# Patient Record
Sex: Male | Born: 1935 | Race: White | Hispanic: No | Marital: Married | Smoking: Former smoker
Health system: Southern US, Community
[De-identification: ages and names within clinical notes are randomized; demographics above are authoritative.]

## PROBLEM LIST (undated history)

## (undated) DIAGNOSIS — M1990 Unspecified osteoarthritis, unspecified site: Secondary | ICD-10-CM

## (undated) DIAGNOSIS — E039 Hypothyroidism, unspecified: Secondary | ICD-10-CM

## (undated) DIAGNOSIS — C801 Malignant (primary) neoplasm, unspecified: Secondary | ICD-10-CM

## (undated) DIAGNOSIS — I10 Essential (primary) hypertension: Secondary | ICD-10-CM

## (undated) HISTORY — PX: FINGER SURGERY: SHX640

---

## 1992-07-22 HISTORY — PX: KNEE ARTHROSCOPY: SUR90

## 1998-01-09 HISTORY — PX: CARDIAC CATHETERIZATION: SHX172

## 2012-06-23 ENCOUNTER — Other Ambulatory Visit: Payer: Self-pay | Admitting: Urology

## 2012-06-23 DIAGNOSIS — N289 Disorder of kidney and ureter, unspecified: Secondary | ICD-10-CM

## 2012-07-02 ENCOUNTER — Ambulatory Visit
Admission: RE | Admit: 2012-07-02 | Discharge: 2012-07-02 | Disposition: A | Discharge status: Home / Self Care | Payer: Medicare Other | Source: Ambulatory Visit | Attending: Urology | Admitting: Urology

## 2012-07-02 ENCOUNTER — Other Ambulatory Visit: Payer: Self-pay | Admitting: Interventional Radiology

## 2012-07-02 VITALS — BP 146/79 | HR 58 | Temp 98.0°F | Resp 18 | Ht 73.0 in | Wt 220.0 lb

## 2012-07-02 DIAGNOSIS — N2889 Other specified disorders of kidney and ureter: Secondary | ICD-10-CM

## 2012-07-02 DIAGNOSIS — N289 Disorder of kidney and ureter, unspecified: Secondary | ICD-10-CM

## 2012-07-02 HISTORY — DX: Malignant (primary) neoplasm, unspecified: C80.1

## 2012-07-04 ENCOUNTER — Ambulatory Visit (HOSPITAL_COMMUNITY)
Admission: RE | Admit: 2012-07-04 | Discharge: 2012-07-04 | Disposition: A | Discharge status: Home / Self Care | Payer: Medicare Other | Source: Ambulatory Visit | Attending: Interventional Radiology | Admitting: Interventional Radiology

## 2012-07-04 ENCOUNTER — Ambulatory Visit (HOSPITAL_COMMUNITY): Admission: RE | Admit: 2012-07-04 | Payer: Medicare Other | Source: Ambulatory Visit

## 2012-07-04 DIAGNOSIS — K5730 Diverticulosis of large intestine without perforation or abscess without bleeding: Secondary | ICD-10-CM | POA: Insufficient documentation

## 2012-07-04 DIAGNOSIS — N281 Cyst of kidney, acquired: Secondary | ICD-10-CM | POA: Insufficient documentation

## 2012-07-04 DIAGNOSIS — E279 Disorder of adrenal gland, unspecified: Secondary | ICD-10-CM | POA: Insufficient documentation

## 2012-07-04 DIAGNOSIS — N289 Disorder of kidney and ureter, unspecified: Secondary | ICD-10-CM | POA: Insufficient documentation

## 2012-07-04 DIAGNOSIS — N2889 Other specified disorders of kidney and ureter: Secondary | ICD-10-CM

## 2012-07-04 MED ORDER — GADOBENATE DIMEGLUMINE 529 MG/ML IV SOLN
20.0000 mL | Freq: Once | INTRAVENOUS | Status: AC | PRN
  Administered 2012-07-04: 20 mL via INTRAVENOUS

## 2012-07-07 ENCOUNTER — Telehealth: Payer: Self-pay | Admitting: Emergency Medicine

## 2012-07-07 NOTE — Telephone Encounter (Signed)
S/W PT ABOUT MRI, DR Fredia Sorrow REVIEWED  AND HE IS GOOD FOR CRYOABLATION.  WE WILL NEED TO CHECK W/ HIS CARDIOLOGIST - DR Fayrene Fearing MCGUKIN TO SEE IF THERE IS ANY CARDIAC CLEARANCE PRIOR TO PROCEDURE.

## 2012-07-08 ENCOUNTER — Telehealth: Payer: Self-pay | Admitting: Emergency Medicine

## 2012-07-08 NOTE — Telephone Encounter (Signed)
LM FOR DR MCGUKIN NURSE TO SEE IF PT NEEDS TO HAVE CARDIAC CLEARANCE PRIOR TO RENAL ABLATION PROCEDURE.   07-09-12 - OFFICE FAXED NOTICE THAT PT DOES NOT NEED CLEARANCE PRIOR TO ABLATION.

## 2012-07-10 ENCOUNTER — Telehealth: Payer: Self-pay | Admitting: Emergency Medicine

## 2012-07-10 NOTE — Telephone Encounter (Signed)
CALLED PT TO MAKE HIM AWARE THAT NO AUTHO IS NEEDED FOR PROCEDURE AND THAT HE DOES NOT NEED CLEARANCE FROM HIS CARDIO PRIOR TO PROCEDURE.  ALSO TOLD PT TO EXPECT A CALL FROM TINA AT [facility] TO SET UP PROCEDURE FOR JAN. HE UNDERSTANDS.

## 2012-08-03 ENCOUNTER — Encounter (HOSPITAL_COMMUNITY): Payer: Self-pay | Admitting: Pharmacy Technician

## 2012-08-05 ENCOUNTER — Other Ambulatory Visit: Payer: Self-pay | Admitting: Radiology

## 2012-08-06 ENCOUNTER — Encounter (HOSPITAL_COMMUNITY)
Admission: RE | Admit: 2012-08-06 | Discharge: 2012-08-06 | Disposition: A | Discharge status: Home / Self Care | Payer: Medicare Other | Source: Ambulatory Visit | Attending: Interventional Radiology | Admitting: Interventional Radiology

## 2012-08-06 ENCOUNTER — Ambulatory Visit (HOSPITAL_COMMUNITY)
Admission: RE | Admit: 2012-08-06 | Discharge: 2012-08-06 | Disposition: A | Discharge status: Home / Self Care | Payer: Medicare Other | Source: Ambulatory Visit | Attending: Interventional Radiology | Admitting: Interventional Radiology

## 2012-08-06 ENCOUNTER — Encounter (HOSPITAL_COMMUNITY): Payer: Self-pay

## 2012-08-06 DIAGNOSIS — N289 Disorder of kidney and ureter, unspecified: Secondary | ICD-10-CM | POA: Insufficient documentation

## 2012-08-06 DIAGNOSIS — I700 Atherosclerosis of aorta: Secondary | ICD-10-CM | POA: Insufficient documentation

## 2012-08-06 DIAGNOSIS — Z01812 Encounter for preprocedural laboratory examination: Secondary | ICD-10-CM | POA: Insufficient documentation

## 2012-08-06 HISTORY — DX: Hypothyroidism, unspecified: E03.9

## 2012-08-06 HISTORY — DX: Unspecified osteoarthritis, unspecified site: M19.90

## 2012-08-06 HISTORY — DX: Essential (primary) hypertension: I10

## 2012-08-06 LAB — CBC
Hemoglobin: 12.8 g/dL — ABNORMAL LOW (ref 13.0–17.0)
MCHC: 33.7 g/dL (ref 30.0–36.0)
MCV: 94.1 fL (ref 78.0–100.0)
Platelets: 205 10*3/uL (ref 150–400)
RBC: 4.04 MIL/uL — ABNORMAL LOW (ref 4.22–5.81)
WBC: 6.9 10*3/uL (ref 4.0–10.5)

## 2012-08-06 LAB — BASIC METABOLIC PANEL
BUN: 16 mg/dL (ref 6–23)
CO2: 27 mEq/L (ref 19–32)
Calcium: 9.1 mg/dL (ref 8.4–10.5)
Chloride: 103 mEq/L (ref 96–112)
GFR calc Af Amer: 86 mL/min — ABNORMAL LOW (ref >90)
GFR calc non Af Amer: 74 mL/min — ABNORMAL LOW (ref >90)
Glucose, Bld: 95 mg/dL (ref 70–99)
Sodium: 141 mEq/L (ref 135–145)

## 2012-08-06 LAB — APTT: aPTT: 28 seconds (ref 24–37)

## 2012-08-06 LAB — SURGICAL PCR SCREEN
MRSA, PCR: NEGATIVE
Staphylococcus aureus: NEGATIVE

## 2012-08-06 LAB — PROTIME-INR: INR: 1.06 (ref 0.00–1.49)

## 2012-08-06 NOTE — Progress Notes (Signed)
08/06/12 1346  OBSTRUCTIVE SLEEP APNEA  Have you ever been diagnosed with sleep apnea through a sleep study? No  Do you snore loudly (loud enough to be heard through closed doors)?  1  Do you often feel tired, fatigued, or sleepy during the daytime? 0  Has anyone observed you stop breathing during your sleep? 1  Do you have, or are you being treated for high blood pressure? 1  BMI more than 35 kg/m2? 0  Age over 77 years old? 1  Neck circumference greater than 40 cm/18 inches? 0  Gender: 1  Obstructive Sleep Apnea Score 5   Score 4 or greater  Results sent to PCP

## 2012-08-06 NOTE — Progress Notes (Signed)
Last office visit 07/29/12 Dr. Desma Maxim on chart, stress test 02/09/09 on chart, EKG 07/29/12 on chart

## 2012-08-06 NOTE — Patient Instructions (Addendum)
22 Eric Foster  08/06/2012   Your procedure is scheduled on: 08/14/12  Report to [facility] at 0530 AM.  Call this number if you have problems the morning of surgery 336-: [phone]   Remember:   Do not eat food or drink liquids After Midnight.     Take these medicines the morning of surgery with A SIP OF WATER: lipitor, synthroid, lopressor, protonix   Do not wear jewelry, make-up or nail polish.  Do not wear lotions, powders, or perfumes. You may wear deodorant.  Do not shave 48 hours prior to surgery. Men may shave face and neck.  Do not bring valuables to the hospital.  Contacts, dentures or bridgework may not be worn into surgery.  Leave suitcase in the car. After surgery it may be brought to your room.  For patients admitted to the hospital, checkout time is 11:00 AM the day of discharge.    Please read over the following fact sheets that you were given: MRSA Information, blood fact sheet Birdie Sons, RN  pre op nurse call if needed [phone]    FAILURE TO FOLLOW THESE INSTRUCTIONS MAY RESULT IN CANCELLATION OF YOUR SURGERY   Patient Signature: ___________________________________________

## 2012-08-14 ENCOUNTER — Encounter (HOSPITAL_COMMUNITY): Payer: Self-pay | Admitting: *Deleted

## 2012-08-14 ENCOUNTER — Encounter (HOSPITAL_COMMUNITY): Payer: Self-pay

## 2012-08-14 ENCOUNTER — Observation Stay (HOSPITAL_COMMUNITY)
Admission: RE | Admit: 2012-08-14 | Discharge: 2012-08-15 | Disposition: A | Discharge status: Home / Self Care | Payer: Medicare Other | Source: Ambulatory Visit | Attending: Interventional Radiology | Admitting: Interventional Radiology

## 2012-08-14 ENCOUNTER — Encounter (HOSPITAL_COMMUNITY)
Admission: RE | Disposition: A | Discharge status: Home / Self Care | Payer: Self-pay | Source: Ambulatory Visit | Attending: Interventional Radiology

## 2012-08-14 ENCOUNTER — Ambulatory Visit (HOSPITAL_COMMUNITY): Payer: Medicare Other | Admitting: Anesthesiology

## 2012-08-14 ENCOUNTER — Ambulatory Visit (HOSPITAL_COMMUNITY)
Admission: RE | Admit: 2012-08-14 | Discharge: 2012-08-14 | Disposition: A | Discharge status: Home / Self Care | Payer: Medicare Other | Source: Ambulatory Visit | Attending: Interventional Radiology | Admitting: Interventional Radiology

## 2012-08-14 ENCOUNTER — Encounter (HOSPITAL_COMMUNITY): Payer: Self-pay | Admitting: Anesthesiology

## 2012-08-14 DIAGNOSIS — I2510 Atherosclerotic heart disease of native coronary artery without angina pectoris: Secondary | ICD-10-CM | POA: Insufficient documentation

## 2012-08-14 DIAGNOSIS — I10 Essential (primary) hypertension: Secondary | ICD-10-CM | POA: Insufficient documentation

## 2012-08-14 DIAGNOSIS — C649 Malignant neoplasm of unspecified kidney, except renal pelvis: Principal | ICD-10-CM | POA: Insufficient documentation

## 2012-08-14 DIAGNOSIS — N2889 Other specified disorders of kidney and ureter: Secondary | ICD-10-CM | POA: Diagnosis present

## 2012-08-14 DIAGNOSIS — E039 Hypothyroidism, unspecified: Secondary | ICD-10-CM | POA: Insufficient documentation

## 2012-08-14 DIAGNOSIS — Z79899 Other long term (current) drug therapy: Secondary | ICD-10-CM | POA: Insufficient documentation

## 2012-08-14 LAB — TYPE AND SCREEN: Antibody Screen: NEGATIVE

## 2012-08-14 LAB — ABO/RH: ABO/RH(D): O NEG

## 2012-08-14 SURGERY — RADIO FREQUENCY ABLATION
Anesthesia: General | Wound class: Clean

## 2012-08-14 MED ORDER — LACTATED RINGERS IV SOLN
INTRAVENOUS | Status: DC
  Administered 2012-08-14: 09:00:00 via INTRAVENOUS
  Administered 2012-08-14: 1000 via INTRAVENOUS
  Administered 2012-08-14: 11:00:00 via INTRAVENOUS

## 2012-08-14 MED ORDER — NIACIN ER (ANTIHYPERLIPIDEMIC) 1000 MG PO TBCR: 1500.0000 mg | EXTENDED_RELEASE_TABLET | Freq: Every day | ORAL | Status: DC

## 2012-08-14 MED ORDER — SODIUM CHLORIDE 0.9 % IV SOLN
INTRAVENOUS | Status: DC
  Administered 2012-08-14: 17:00:00 via INTRAVENOUS

## 2012-08-14 MED ORDER — EPHEDRINE SULFATE 50 MG/ML IJ SOLN
INTRAMUSCULAR | Status: DC | PRN
  Administered 2012-08-14 (×2): 5 mg via INTRAVENOUS
  Administered 2012-08-14: 15 mg via INTRAVENOUS
  Administered 2012-08-14: 10 mg via INTRAVENOUS

## 2012-08-14 MED ORDER — DOCUSATE SODIUM 100 MG PO CAPS
100.0000 mg | ORAL_CAPSULE | Freq: Two times a day (BID) | ORAL | Status: DC
  Administered 2012-08-14 – 2012-08-15 (×3): 100 mg via ORAL
  Filled 2012-08-14 (×5): qty 1

## 2012-08-14 MED ORDER — SUCCINYLCHOLINE CHLORIDE 20 MG/ML IJ SOLN
INTRAMUSCULAR | Status: DC | PRN
  Administered 2012-08-14: 100 mg via INTRAVENOUS

## 2012-08-14 MED ORDER — DEXAMETHASONE SODIUM PHOSPHATE 10 MG/ML IJ SOLN
INTRAMUSCULAR | Status: DC | PRN
  Administered 2012-08-14: 10 mg via INTRAVENOUS

## 2012-08-14 MED ORDER — CEFAZOLIN SODIUM-DEXTROSE 2-3 GM-% IV SOLR
2.0000 g | INTRAVENOUS | Status: AC
  Administered 2012-08-14: 2 g via INTRAVENOUS
  Filled 2012-08-14 (×2): qty 50

## 2012-08-14 MED ORDER — GLYCOPYRROLATE 0.2 MG/ML IJ SOLN
INTRAMUSCULAR | Status: DC | PRN
  Administered 2012-08-14: 0.4 mg via INTRAVENOUS

## 2012-08-14 MED ORDER — HYDROMORPHONE HCL PF 1 MG/ML IJ SOLN: 0.2500 mg | INTRAMUSCULAR | Status: DC | PRN

## 2012-08-14 MED ORDER — NIACIN ER 500 MG PO TBCR
1500.0000 mg | EXTENDED_RELEASE_TABLET | Freq: Every day | ORAL | Status: DC
  Administered 2012-08-14: 1500 mg via ORAL
  Filled 2012-08-14 (×3): qty 3

## 2012-08-14 MED ORDER — FENTANYL CITRATE 0.05 MG/ML IJ SOLN
INTRAMUSCULAR | Status: DC | PRN
  Administered 2012-08-14: 50 ug via INTRAVENOUS

## 2012-08-14 MED ORDER — ASPIRIN EC 81 MG PO TBEC
81.0000 mg | DELAYED_RELEASE_TABLET | Freq: Every morning | ORAL | Status: DC
Start: 2012-08-14 — End: 2012-08-15
  Administered 2012-08-14 – 2012-08-15 (×2): 81 mg via ORAL
  Filled 2012-08-14 (×2): qty 1

## 2012-08-14 MED ORDER — ROCURONIUM BROMIDE 100 MG/10ML IV SOLN
INTRAVENOUS | Status: DC | PRN
  Administered 2012-08-14: 30 mg via INTRAVENOUS
  Administered 2012-08-14: 20 mg via INTRAVENOUS

## 2012-08-14 MED ORDER — ATORVASTATIN CALCIUM 10 MG PO TABS
10.0000 mg | ORAL_TABLET | Freq: Every morning | ORAL | Status: DC
  Administered 2012-08-15: 10 mg via ORAL
  Filled 2012-08-14 (×2): qty 1

## 2012-08-14 MED ORDER — NEOSTIGMINE METHYLSULFATE 1 MG/ML IJ SOLN
INTRAMUSCULAR | Status: DC | PRN
  Administered 2012-08-14: 3 mg via INTRAVENOUS

## 2012-08-14 MED ORDER — LACTATED RINGERS IV SOLN: INTRAVENOUS | Status: DC

## 2012-08-14 MED ORDER — ONDANSETRON HCL 4 MG/2ML IJ SOLN: 4.0000 mg | Freq: Four times a day (QID) | INTRAMUSCULAR | Status: DC | PRN

## 2012-08-14 MED ORDER — PHENYLEPHRINE HCL 10 MG/ML IJ SOLN
INTRAMUSCULAR | Status: DC | PRN
  Administered 2012-08-14 (×2): 80 ug via INTRAVENOUS

## 2012-08-14 MED ORDER — SENNOSIDES-DOCUSATE SODIUM 8.6-50 MG PO TABS
1.0000 | ORAL_TABLET | Freq: Every day | ORAL | Status: DC | PRN
  Filled 2012-08-14: qty 1

## 2012-08-14 MED ORDER — LIDOCAINE HCL (CARDIAC) 20 MG/ML IV SOLN
INTRAVENOUS | Status: DC | PRN
  Administered 2012-08-14: 100 mg via INTRAVENOUS

## 2012-08-14 MED ORDER — LEVOTHYROXINE SODIUM 100 MCG PO TABS
100.0000 ug | ORAL_TABLET | Freq: Every morning | ORAL | Status: DC
  Administered 2012-08-15: 100 ug via ORAL
  Filled 2012-08-14 (×2): qty 1

## 2012-08-14 MED ORDER — PROMETHAZINE HCL 25 MG/ML IJ SOLN: 6.2500 mg | INTRAMUSCULAR | Status: DC | PRN

## 2012-08-14 MED ORDER — RAMIPRIL 10 MG PO CAPS
10.0000 mg | ORAL_CAPSULE | Freq: Every morning | ORAL | Status: DC
  Administered 2012-08-15: 10 mg via ORAL
  Filled 2012-08-14 (×2): qty 1

## 2012-08-14 MED ORDER — PROPOFOL 10 MG/ML IV BOLUS
INTRAVENOUS | Status: DC | PRN
  Administered 2012-08-14: 150 mg via INTRAVENOUS

## 2012-08-14 MED ORDER — ONDANSETRON HCL 4 MG/2ML IJ SOLN
INTRAMUSCULAR | Status: DC | PRN
  Administered 2012-08-14: 4 mg via INTRAVENOUS

## 2012-08-14 MED ORDER — NIACIN ER 500 MG PO CPCR
1500.0000 mg | ORAL_CAPSULE | Freq: Every day | ORAL | Status: DC
  Filled 2012-08-14: qty 3

## 2012-08-14 MED ORDER — METOPROLOL SUCCINATE ER 25 MG PO TB24
25.0000 mg | ORAL_TABLET | Freq: Every morning | ORAL | Status: DC
  Administered 2012-08-15: 25 mg via ORAL
  Filled 2012-08-14 (×2): qty 1

## 2012-08-14 MED ORDER — PANTOPRAZOLE SODIUM 40 MG PO TBEC
40.0000 mg | DELAYED_RELEASE_TABLET | Freq: Every day | ORAL | Status: DC
  Filled 2012-08-14 (×2): qty 1

## 2012-08-14 MED ORDER — HYDROCODONE-ACETAMINOPHEN 5-325 MG PO TABS: 1.0000 | ORAL_TABLET | ORAL | Status: DC | PRN

## 2012-08-14 NOTE — Addendum Note (Signed)
Addendum  created 08/14/12 1205 by Doran Clay, CRNA   Modules edited:Anesthesia LDA

## 2012-08-14 NOTE — Procedures (Signed)
Procedure:  CT guided biopsy and cryoablation of left renal mass. Anesthesia:  General Findings:  3.5 cm left renal mass.  18 G core bx x 2 via 17 G needle. Perc cryo via 4 separate [employer] Rod Plus probes. Plan:  PACU recovery followed by overnight observation

## 2012-08-14 NOTE — Care Management (Signed)
Chart reviewed.  Tyneka Scafidi,Rn,BSN [id number] 

## 2012-08-14 NOTE — Progress Notes (Signed)
Day of Surgery  Subjective: Doing well after biopsy and cryoablation of left renal tumor.  No pain.  Urine clear.  Objective: Vital signs in last 24 hours: Temp:  [97.1 F (36.2 C)-97.8 F (36.6 C)] 97.2 F (36.2 C) (01/24 1330) Pulse Rate:  [58-69] 65  (01/24 1330) Resp:  [15-20] 17  (01/24 1330) BP: (117-149)/(64-78) 124/65 mmHg (01/24 1330) SpO2:  [92 %-100 %] 98 % (01/24 1330) Weight:  [222 lb (100.699 kg)] 222 lb (100.699 kg) (01/24 1230) Last BM Date: 08/13/12  Intake/Output from previous day:   Intake/Output this shift: Total I/O In: 2500 [I.V.:2500] Out: 400 [Urine:400]  Exam:  Left flank nontender.  Lab Results:  No results found for this basename: WBC:2,HGB:2,HCT:2,PLT:2 in the last 72 hours BMET No results found for this basename: NA:2,K:2,CL:2,CO2:2,GLUCOSE:2,BUN:2,CREATININE:2,CALCIUM:2 in the last 72 hours PT/INR No results found for this basename: LABPROT:2,INR:2 in the last 72 hours ABG No results found for this basename: PHART:2,PCO2:2,PO2:2,HCO3:2 in the last 72 hours  Studies/Results: No results found.  Anti-infectives: Anti-infectives     Start     Dose/Rate Route Frequency Ordered Stop   08/14/12 0618   ceFAZolin (ANCEF) IVPB 2 g/50 mL premix        2 g 100 mL/hr over 30 Minutes Intravenous On call 08/14/12 0618 08/14/12 0825          Assessment/Plan: s/p Procedure(s) (LRB) with comments: RADIO FREQUENCY ABLATION (N/A) - CRYO ABLATION   Status post cryoablation of left renal tumor.  No evidence of complication.  Patient doing well.  D/C Foley later tonight and ambulate.  Check labs in AM.  LOS: 0 days    Eric Foster T 08/14/2012

## 2012-08-14 NOTE — Anesthesia Preprocedure Evaluation (Addendum)
Anesthesia Evaluation  Patient identified by MRN, date of birth, ID band Patient awake    Reviewed: Allergy & Precautions, H&P , NPO status , Patient's Chart, lab work & pertinent test results  Airway Mallampati: II TM Distance: >3 FB Neck ROM: Full    Dental  (+) Teeth Intact, Caps and Dental Advisory Given   Pulmonary neg pulmonary ROS, former smoker,  breath sounds clear to auscultation  Pulmonary exam normal       Cardiovascular hypertension, Pt. on home beta blockers + CAD and + Cardiac Stents (Cardiac stent X 2) Rhythm:Regular Rate:Normal     Neuro/Psych negative neurological ROS  negative psych ROS   GI/Hepatic negative GI ROS, Neg liver ROS,   Endo/Other  Hypothyroidism   Renal/GU negative Renal ROS  negative genitourinary   Musculoskeletal negative musculoskeletal ROS (+)   Abdominal   Peds  Hematology negative hematology ROS (+)   Anesthesia Other Findings   Reproductive/Obstetrics negative OB ROS                          Anesthesia Physical Anesthesia Plan  ASA: III  Anesthesia Plan: General   Post-op Pain Management:    Induction: Intravenous  Airway Management Planned: Oral ETT  Additional Equipment:   Intra-op Plan:   Post-operative Plan: Extubation in OR  Informed Consent: I have reviewed the patients History and Physical, chart, labs and discussed the procedure including the risks, benefits and alternatives for the proposed anesthesia with the patient or authorized representative who has indicated his/her understanding and acceptance.   Dental advisory given  Plan Discussed with: CRNA  Anesthesia Plan Comments:         Anesthesia Quick Evaluation

## 2012-08-14 NOTE — Anesthesia Postprocedure Evaluation (Signed)
Anesthesia Post Note  Patient: Eric Foster  Procedure(s) Performed: Procedure(s) (LRB): RADIO FREQUENCY ABLATION (N/A)  Anesthesia type: General  Patient location: PACU  Post pain: Pain level controlled  Post assessment: Post-op Vital signs reviewed  Last Vitals:  Filed Vitals:   08/14/12 1130  BP:   Pulse:   Temp: 36.6 C  Resp:     Post vital signs: Reviewed  Level of consciousness: sedated  Complications: No apparent anesthesia complications

## 2012-08-14 NOTE — H&P (Signed)
Chief Complaint: "I'm here to freeze my kidney" Referring Physician:Eskew HPI: Eric Foster is an 79 y.o. male who was seen by Dr. Fredia Sorrow in consult to eval possible cryoablation of newly found (L)renal mass. See IR Rad Eval note in PACS for details. He is now scheduled for procedure. He has seen his cardiologist in [facility] and his note for risk assessment is in chart. Pt feels well and denies any new or recent c/o illness. PMHx and meds reviewed as below.  Past Medical History:  Past Medical History  Diagnosis Date  . Cancer 2005 or 2006    melanoma, on back/shoulder  . Hypertension   . Hypothyroidism   . Arthritis     Past Surgical History:  Past Surgical History  Procedure Date  . Knee arthroscopy 1994  . Cardiac catheterization 01/09/98    with 2 stents  . Finger surgery     right pinky    Family History: No family history on file.  Social History:  reports that he quit smoking about 54 years ago. His smoking use included Cigarettes and Pipe. He has a 2.5 pack-year smoking history. He has never used smokeless tobacco. He reports that he drinks alcohol. He reports that he does not use illicit drugs.  Allergies: No Known Allergies  Medications: aspirin 325 MG EC tablet (Taking) Sig - Route: Take 81 mg by mouth every morning. - Oral Class: Historical Med Number of times this order has been changed since signing: 2 Order [address] atorvastatin (LIPITOR) 20 MG tablet (Taking) Sig - Route: Take 10 mg by mouth every morning. - Oral Class: Historical Med Number of times this order has been changed since signing: 2 Order [address] levothyroxine (SYNTHROID, LEVOTHROID) 100 MCG tablet (Taking) Sig - Route: Take 100 mcg by mouth every morning. - Oral Class: Historical Med Number of times this order has been changed since signing: 2 Order [address] metoprolol succinate (TOPROL-XL) 50 MG 24 hr tablet (Taking) Sig - Route: Take 25 mg by mouth every morning. Take with or immediately  following a meal. - Oral Class: Historical Med Number of times this order has been changed since signing: 2 Order [address] niacin (NIASPAN) 1000 MG CR tablet (Taking) Sig - Route: Take 1,500 mg by mouth at bedtime. - Oral Class: Historical Med Number of times this order has been changed since signing: 1 Order [address] pantoprazole (PROTONIX) 40 MG tablet (Taking) Sig - Route: Take 40 mg by mouth every morning. - Oral Class: Historical Med Number of times this order has been changed since signing: 2 Order [address] ramipril (ALTACE) 10 MG capsule (Taking) Sig - Route: Take 10 mg by mouth every morning. - Oral Class: Historical Med   Please HPI for pertinent positives, otherwise complete 10 system ROS negative.  Physical Exam: Temp: 97.5, HR: 67, RR:18, BP: 129/78, O2: 97%   General Appearance:  Alert, cooperative, no distress, appears stated age  Head:  Normocephalic, without obvious abnormality, atraumatic  ENT: Unremarkable  Neck: Supple, symmetrical, trachea midline, no adenopathy, thyroid: not enlarged, symmetric, no tenderness/mass/nodules  Lungs:   Clear to auscultation bilaterally, no w/r/r, respirations unlabored without use of accessory muscles.  Heart:  Regular rate and rhythm, S1, S2 normal, no murmur, rub or gallop. Carotids 2+ without bruit.  Abdomen:   Soft, non-tender, non distended. Bowel sounds active all four quadrants,  no masses, no organomegaly.  Neurologic: Normal affect, no gross deficits.   Results for orders placed during the hospital  encounter of 08/14/12 (from the past 48 hour(s))  ABO/RH     Status: Normal   Collection Time   08/14/12  6:30 AM      Component Value Range Comment   ABO/RH(D) O NEG     TYPE AND SCREEN     Status: Normal   Collection Time   08/14/12  6:45 AM      Component Value Range Comment   ABO/RH(D) O NEG      Antibody Screen NEG      Sample Expiration 08/17/2012      No results found.  Assessment/Plan (L)renal mass For CT guided  (L)renal mass cryoablation Reviewed procedure, including risks, complications, and plan for admission for overnight observation. Labs reviewed, ok Consent signed in chart  Brayton El PA-C 08/14/2012, 8:10 AM

## 2012-08-14 NOTE — H&P (Signed)
Agree 

## 2012-08-14 NOTE — Addendum Note (Signed)
Addendum  created 08/14/12 1205 by Greg Eckrich R Eyleen Rawlinson, CRNA   Modules edited:Anesthesia LDA    

## 2012-08-14 NOTE — Transfer of Care (Signed)
Immediate Anesthesia Transfer of Care Note  Patient: Eric Foster  Procedure(s) Performed: Procedure(s) (LRB) with comments: RADIO FREQUENCY ABLATION (N/A) - CRYO ABLATION   Patient Location: PACU  Anesthesia Type:General  Level of Consciousness: awake, alert , oriented and patient cooperative  Airway & Oxygen Therapy: Patient Spontanous Breathing and Patient connected to face mask oxygen  Post-op Assessment: Report given to PACU RN and Post -op Vital signs reviewed and stable  Post vital signs: Reviewed and stable  Complications: No apparent anesthesia complications

## 2012-08-15 ENCOUNTER — Other Ambulatory Visit: Payer: Self-pay | Admitting: Radiology

## 2012-08-15 DIAGNOSIS — N2889 Other specified disorders of kidney and ureter: Secondary | ICD-10-CM | POA: Diagnosis present

## 2012-08-15 LAB — CBC
Hemoglobin: 10.5 g/dL — ABNORMAL LOW (ref 13.0–17.0)
MCH: 31.2 pg (ref 26.0–34.0)
RBC: 3.37 MIL/uL — ABNORMAL LOW (ref 4.22–5.81)
WBC: 10.5 10*3/uL (ref 4.0–10.5)

## 2012-08-15 LAB — BASIC METABOLIC PANEL
CO2: 26 mEq/L (ref 19–32)
Chloride: 101 mEq/L (ref 96–112)
Glucose, Bld: 119 mg/dL — ABNORMAL HIGH (ref 70–99)
Potassium: 4.2 mEq/L (ref 3.5–5.1)
Sodium: 135 mEq/L (ref 135–145)

## 2012-08-15 NOTE — Discharge Summary (Signed)
Agree.  OK for discharge today.  Follow up in clinic in 4 weeks.

## 2012-08-15 NOTE — Care Management (Signed)
UR completed 

## 2012-08-15 NOTE — Discharge Summary (Signed)
Physician Discharge Summary  Patient ID: Eric Foster MRN: [id number] DOB/AGE: 04/12/1926 79 y.o.  Admit date: 08/14/2012 Discharge date: 08/15/2012  Admission Diagnoses: Principal Problem:  *Renal mass, left  Discharge Diagnoses:  Principal Problem:  *Renal mass, left    Procedures: Procedure(s): CT GUIDED CRYOABLATION (L)RENAL MASS 08/14/12  Discharged Condition: good  Hospital Course: HPI: Eric Foster is an 79 y.o. male who was seen by Dr. Fredia Sorrow in consult to eval possible cryoablation of newly found (L)renal mass. See IR Rad Eval note in PACS for details.  He is now scheduled for procedure.  He has seen his cardiologist in [facility] and his note for risk assessment is in chart.  Pt feels well and denies any new or recent c/o illness.  Pt underwent successful CT guided cryoablation of (L)renal mass. No immediate complication during procedure. He was taken to PACU for recovery and then to floor for observation.  No signifiicant post op issues. Foley was removed and he is voiding well. He has minimal pain and has been OOB walking. His labs have been reviewed and there is a small drop in his hemoglobin, likely dilutional from IVF. He has remained hemodynamically stable and offers no complaints. He is determined to be stable for discharge. I have reviewed all his home meds and discharge instructions as well as follow up plans.  Consults: None   Discharge Exam: Blood pressure 112/55, pulse 65, temperature 98.3 F (36.8 C), temperature source Oral, resp. rate 16, height 6\' 1"  (1.854 m), weight 222 lb (100.699 kg), SpO2 99.00%. Lungs: CTA without w/r/r  Heart: Regular  Abdomen; soft, ND, NT  Back: (L)flank probe sites clean, no hematoma, minimal tenderness   Disposition: Home      Discharge Orders    Future Orders Please Complete By Expires   Diet - low sodium heart healthy      Increase activity slowly      May shower / Bathe      May walk up steps      Driving  Restrictions      Comments:   No driving for a few days unless emergency.   No dressing needed      Call MD for:  temperature >100.4      Call MD for:  persistant nausea and vomiting      Call MD for:  severe uncontrolled pain      Call MD for:  redness, tenderness, or signs of infection (pain, swelling, redness, odor or green/yellow discharge around incision site)          Medication List     As of 08/15/2012  8:14 AM    TAKE these medications         aspirin 325 MG EC tablet   Take 81 mg by mouth every morning.      atorvastatin 20 MG tablet   Commonly known as: LIPITOR   Take 10 mg by mouth every morning.      levothyroxine 100 MCG tablet   Commonly known as: SYNTHROID, LEVOTHROID   Take 100 mcg by mouth every morning.      metoprolol succinate 50 MG 24 hr tablet   Commonly known as: TOPROL-XL   Take 25 mg by mouth every morning. Take with or immediately following a meal.      niacin 1000 MG CR tablet   Commonly known as: NIASPAN   Take 1,500 mg by mouth at bedtime.      pantoprazole 40 MG tablet  Commonly known as: PROTONIX   Take 40 mg by mouth every morning.      ramipril 10 MG capsule   Commonly known as: ALTACE   Take 10 mg by mouth every morning.        Follow-up Information    Follow up with [facility], MD. (Office will call for appointment)    Contact information:   [address], [address] [place] [address] [phone]         Signed: Brayton El PA-C 08/15/2012, 8:14 AM

## 2012-08-15 NOTE — Progress Notes (Signed)
NOTED BLOOD-TINGED URINE IN FOLEY BAG , NO CLOTS, CALLED DR. YAMAGATA PRIOR TO TAKING FOLEY OUT.

## 2012-08-15 NOTE — Progress Notes (Signed)
Subjective: Pt feeling great, has minimal pain at procedure site. Foley out, voiding well, no gross hematuria Tol reg diet, no N/V Has been OOB walking halls  Objective: Physical Exam: BP 112/55  Pulse 65  Temp 98.3 F (36.8 C) (Oral)  Resp 16  Ht 6\' 1"  (1.854 m)  Wt 222 lb (100.699 kg)  BMI 29.29 kg/m2  SpO2 99% Lungs: CTA without w/r/r Heart: Regular Abdomen; soft, ND, NT Back: (L)flank probe sites clean, no hematoma, minimal tenderness   Labs: CBC  Basename 08/15/12 0415  WBC 10.5  HGB 10.5*  HCT 31.7*  PLT 145*   BMET  Basename 08/15/12 0415  NA 135  K 4.2  CL 101  CO2 26  GLUCOSE 119*  BUN 16  CREATININE 1.17  CALCIUM 8.8   LFT No results found for this basename: PROT,ALBUMIN,AST,ALT,ALKPHOS,BILITOT,BILIDIR,IBILI,LIPASE in the last 72 hours PT/INR No results found for this basename: LABPROT:2,INR:2 in the last 72 hours   Studies/Results: No results found.  Assessment/Plan: S/p cryoablation (L)renal mass Doing very well. Hgb dropped 2 points but likely dilutional from IVF  Hemodynamically stable Ready for discharge.    LOS: 1 day    Brayton El PA-C 08/15/2012 8:08 AM

## 2012-08-21 ENCOUNTER — Other Ambulatory Visit: Payer: Self-pay | Admitting: Radiology

## 2012-08-21 ENCOUNTER — Other Ambulatory Visit (HOSPITAL_COMMUNITY): Payer: Self-pay | Admitting: Interventional Radiology

## 2012-08-21 DIAGNOSIS — N2889 Other specified disorders of kidney and ureter: Secondary | ICD-10-CM

## 2012-08-25 ENCOUNTER — Other Ambulatory Visit: Payer: Self-pay | Admitting: Emergency Medicine

## 2012-08-25 DIAGNOSIS — C649 Malignant neoplasm of unspecified kidney, except renal pelvis: Secondary | ICD-10-CM

## 2012-09-17 ENCOUNTER — Ambulatory Visit
Admission: RE | Admit: 2012-09-17 | Discharge: 2012-09-17 | Disposition: A | Discharge status: Home / Self Care | Payer: Medicare Other | Source: Ambulatory Visit | Attending: Radiology | Admitting: Radiology

## 2012-09-17 ENCOUNTER — Ambulatory Visit (HOSPITAL_COMMUNITY)
Admission: RE | Admit: 2012-09-17 | Discharge: 2012-09-17 | Disposition: A | Discharge status: Home / Self Care | Payer: Medicare Other | Source: Ambulatory Visit | Attending: Interventional Radiology | Admitting: Interventional Radiology

## 2012-09-17 ENCOUNTER — Encounter (HOSPITAL_COMMUNITY): Payer: Self-pay

## 2012-09-17 DIAGNOSIS — N2889 Other specified disorders of kidney and ureter: Secondary | ICD-10-CM

## 2012-09-17 DIAGNOSIS — C649 Malignant neoplasm of unspecified kidney, except renal pelvis: Secondary | ICD-10-CM | POA: Insufficient documentation

## 2012-09-17 DIAGNOSIS — N200 Calculus of kidney: Secondary | ICD-10-CM | POA: Insufficient documentation

## 2012-09-17 DIAGNOSIS — D3500 Benign neoplasm of unspecified adrenal gland: Secondary | ICD-10-CM | POA: Insufficient documentation

## 2012-09-17 MED ORDER — IOHEXOL 300 MG/ML  SOLN
100.0000 mL | Freq: Once | INTRAMUSCULAR | Status: AC | PRN
  Administered 2012-09-17: 100 mL via INTRAVENOUS

## 2012-09-17 NOTE — Progress Notes (Signed)
Denies hematuria or any other problems w/ urination.  States that he is experiencing burning sensation of LLQ which does not require pain medications.  Has resumed normal activities.

## 2012-09-21 NOTE — Progress Notes (Signed)
Patient ID: Eric Foster, male   DOB: [birth year], 79 y.o.   MRN: [id number]  ESTABLISHED PATIENT OFFICE VISIT  Chief Complaint: Status post percutaneous cryoablation of a left renal carcinoma on 08/14/2012.  History: Eric Foster returns for initial follow-up after recent percutaneous cryoablation of a left renal mass. 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 The patient tolerated cryoablation extremely well with essentially no postprocedural symptoms. He did fall in his garage yesterday, injuring his right knee. He is able to ambulate and states that the knee is sore and swollen. He has otherwise been performing normal activities without any limitation.  Review of Systems: No fever, chills, nausea, vomiting, hematuria, dysuria, abdominal pain or flank pain.  Exam: Vital signs: Blood pressure 123/67, pulse 76, respirations 17, temperature 97.8, oxygen saturation 95% on room air. General: No acute distress. Abdomen: Soft and nontender. No CVA tenderness. Extremities: Right knee demonstrates predominately medial soft tissue swelling and mild abrasions. No significant hematoma or ecchymosis identified.  Labs: BUN 16, creatinine 1.2 and estimated GFR 58 ml/minute on 09/10/2012.  Imaging: Follow-up CT was performed today. This shows a large cryoablation defect at the level of the treated left lower pole carcinoma. There is no evidence of complication. On my review, there may be some very subtle crescentic enhancement along the deep margin of the ablation inferiorly. This is not definitive for residual viable tumor at this point and will be followed.  Assessment and Plan: Eric Foster is doing extremely well after cryoablation of a biopsy proven papillary left renal carcinoma. He has not had any complications and renal function is stable after treatment. The mass was 3.7 cm in diameter at the time of treatment and did require  use of four cryoablation probes. Initial postprocedural imaging shows adequate appearance of ablation with no convincing residual enhancing tumor. However, there may be some subtle enhancement along the deep margin of ablation. This can sometimes simply be treatment related hyperemia, and not necessarily indicative of residual tumor. Given the fairly large size of the lesion at the time of treatment, I told the patient that he may be at slightly higher risk of recurrence which will be followed over time. I recommended another follow-up CT and office visit in 5 months at which time the patient will be 6 months post ablation.

## 2012-12-31 ENCOUNTER — Other Ambulatory Visit: Payer: Self-pay | Admitting: Radiology

## 2012-12-31 ENCOUNTER — Other Ambulatory Visit (HOSPITAL_COMMUNITY): Payer: Self-pay | Admitting: Interventional Radiology

## 2012-12-31 DIAGNOSIS — C642 Malignant neoplasm of left kidney, except renal pelvis: Secondary | ICD-10-CM

## 2013-01-11 ENCOUNTER — Telehealth: Payer: Self-pay | Admitting: Emergency Medicine

## 2013-01-11 NOTE — Telephone Encounter (Signed)
PT W/ CC OF HEMATURIA ALSO HAD THIS 6WEEKS AGO.  PT WENT TO SEE DR ESKEW AND THEY DID A U/A AND PUT PT ON CIPRO.  CIPRO DID NOT AGREE WITH PT SO HE STOPPED AND CALLED [place] THIS MORNING.  DO I NEED TO MOVE MY APPT W/ DR GY SOONER?  1330- S/W DR GY VIA PHONE- NOT RELATED TO CRYO SINCE OUT.  PT NEEDS TO CALL DR ESKEW BACK TO HAVE MEDS CHANGED IF HE IS NOT TAKING HIS CIPRO AND MY HAVE A UTI AND NEEDS A DIFFERENT ANTI-B.    1340- PT CALLED BACK AND I EXPLAINED THE ABOVE AND TOLD HIM TO KEEP HIS F/U APPT W/ DR ESKEW AND APPT HERE FOR July.  TOLD PT TO CALL TO GET A DIFFERENT ANTI-B FROM DR ESKEW.

## 2013-01-28 LAB — CREATININE WITH EST GFR: Creat: 1.05 mg/dL (ref 0.50–1.35)

## 2013-01-28 LAB — BUN: BUN: 16 mg/dL (ref 6–23)

## 2013-02-10 ENCOUNTER — Ambulatory Visit (HOSPITAL_COMMUNITY)
Admission: RE | Admit: 2013-02-10 | Discharge: 2013-02-10 | Disposition: A | Discharge status: Home / Self Care | Payer: Medicare Other | Source: Ambulatory Visit | Attending: Interventional Radiology | Admitting: Interventional Radiology

## 2013-02-10 ENCOUNTER — Ambulatory Visit
Admission: RE | Admit: 2013-02-10 | Discharge: 2013-02-10 | Disposition: A | Discharge status: Home / Self Care | Payer: Medicare Other | Source: Ambulatory Visit | Attending: Interventional Radiology | Admitting: Interventional Radiology

## 2013-02-10 DIAGNOSIS — C642 Malignant neoplasm of left kidney, except renal pelvis: Secondary | ICD-10-CM

## 2013-02-10 DIAGNOSIS — D3500 Benign neoplasm of unspecified adrenal gland: Secondary | ICD-10-CM | POA: Insufficient documentation

## 2013-02-10 DIAGNOSIS — C649 Malignant neoplasm of unspecified kidney, except renal pelvis: Secondary | ICD-10-CM | POA: Insufficient documentation

## 2013-02-10 DIAGNOSIS — I700 Atherosclerosis of aorta: Secondary | ICD-10-CM | POA: Insufficient documentation

## 2013-02-10 DIAGNOSIS — N281 Cyst of kidney, acquired: Secondary | ICD-10-CM | POA: Insufficient documentation

## 2013-02-10 MED ORDER — IOHEXOL 300 MG/ML  SOLN
100.0000 mL | Freq: Once | INTRAMUSCULAR | Status: AC | PRN
  Administered 2013-02-10: 100 mL via INTRAVENOUS

## 2013-02-10 NOTE — Progress Notes (Signed)
Patient ID: Eric Foster, male   DOB: 11/02/1925, 79 y.o.   MRN: [id number]  ESTABLISHED PATIENT OFFICE VISIT  Chief Complaint: Status post percutaneous cryoablation of a left papillary renal carcinoma on 08/14/2012.  History: Mr. Langworthy has recently been diagnosed with multiple bladder tumors after a workup by Dr. Lindley Magnus. This was begun after two separate episodes of gross hematuria approximately 6 - 7 weeks ago and 4 weeks ago. He had cystoscopy last Monday demonstrating a tumor of the posterior right bladder floor and two separate anterior tumors of the bladder neck. He is scheduled for tumor resection and intravesical chemotherapy next Thursday with Dr. Lindley Magnus.  Review of Systems: No fever or chills. No flank pain. No dysuria.  Exam: Vital signs: Blood pressure 107/63, pulse 64, respirations 17, temperature 98.2, oxygen saturation 95% on room air. General: No acute distress. Abdomen: Soft and nontender. No flank tenderness.  Labs: BUN 16, creatinine 1.05 and estimated GFR 65 ml/minute on 01/27/2013.  Imaging: Follow-up CT was performed today of the abdomen with and without contrast. This demonstrates diminished size of a left renal cryoablation zone since prior imaging in February. There is no further suggestion of possible subtle enhancement along the inferior margin of ablation. No abnormal enhancement is seen to suggest residual or recurrent tumor. There is no evidence of complication following renal ablation.  Assessment and Plan: No evidence of renal carcinoma recurrence 6 months post cryoablation of a left renal papillary carcinoma. I have recommended additional CT follow-up imaging in late January of next year, 1 year post ablation.

## 2013-02-10 NOTE — Progress Notes (Signed)
Seen by Dr Lindley Magnus on 02/01/2013 for hematuria.  Patient is scheduled on 02/18/2013 for surgical management of bladder tumors.     Denies hematuria at present.   Also denies any other problems with urination.  Denies discomfort associated with cryoablation.    Keriana Sarsfield Carmell Austria, RN 02/10/2013 11:04 AM

## 2013-07-07 ENCOUNTER — Other Ambulatory Visit: Payer: Self-pay | Admitting: Radiology

## 2013-07-07 ENCOUNTER — Other Ambulatory Visit (HOSPITAL_COMMUNITY): Payer: Self-pay | Admitting: Interventional Radiology

## 2013-07-07 DIAGNOSIS — C642 Malignant neoplasm of left kidney, except renal pelvis: Secondary | ICD-10-CM

## 2013-08-03 LAB — CREATININE WITH EST GFR
Creat: 1.02 mg/dL (ref 0.50–1.35)
GFR, Est African American: 77 mL/min
GFR, Est Non African American: 67 mL/min

## 2013-08-03 LAB — BUN: BUN: 13 mg/dL (ref 6–23)

## 2013-08-10 ENCOUNTER — Ambulatory Visit
Admission: RE | Admit: 2013-08-10 | Discharge: 2013-08-10 | Disposition: A | Discharge status: Home / Self Care | Payer: Medicare Other | Source: Ambulatory Visit | Attending: Interventional Radiology | Admitting: Interventional Radiology

## 2013-08-10 ENCOUNTER — Encounter (HOSPITAL_COMMUNITY): Payer: Self-pay

## 2013-08-10 ENCOUNTER — Ambulatory Visit (HOSPITAL_COMMUNITY)
Admission: RE | Admit: 2013-08-10 | Discharge: 2013-08-10 | Disposition: A | Discharge status: Home / Self Care | Payer: Medicare Other | Source: Ambulatory Visit | Attending: Interventional Radiology | Admitting: Interventional Radiology

## 2013-08-10 DIAGNOSIS — K5730 Diverticulosis of large intestine without perforation or abscess without bleeding: Secondary | ICD-10-CM | POA: Insufficient documentation

## 2013-08-10 DIAGNOSIS — C642 Malignant neoplasm of left kidney, except renal pelvis: Secondary | ICD-10-CM

## 2013-08-10 DIAGNOSIS — D3500 Benign neoplasm of unspecified adrenal gland: Secondary | ICD-10-CM | POA: Insufficient documentation

## 2013-08-10 DIAGNOSIS — C649 Malignant neoplasm of unspecified kidney, except renal pelvis: Secondary | ICD-10-CM | POA: Insufficient documentation

## 2013-08-10 DIAGNOSIS — N281 Cyst of kidney, acquired: Secondary | ICD-10-CM | POA: Insufficient documentation

## 2013-08-10 MED ORDER — IOHEXOL 300 MG/ML  SOLN
100.0000 mL | Freq: Once | INTRAMUSCULAR | Status: AC | PRN
  Administered 2013-08-10: 100 mL via INTRAVENOUS

## 2013-08-10 NOTE — Progress Notes (Signed)
Denies hematuria or any current problems with urination.  Denies pain associated with cryoablation.  States that he was evaluated for hematuria last year.  Surgery by Dr Estill Dooms followed by 6 chemo Rx's.    Ascencion Stegner Riki Rusk, RN 08/10/2013 3:31 PM

## 2014-07-20 ENCOUNTER — Other Ambulatory Visit (HOSPITAL_COMMUNITY): Payer: Medicare Other

## 2014-07-20 ENCOUNTER — Encounter: Payer: Self-pay | Admitting: Radiology

## 2014-07-20 ENCOUNTER — Other Ambulatory Visit: Payer: Self-pay | Admitting: Radiology

## 2014-07-20 ENCOUNTER — Other Ambulatory Visit (HOSPITAL_COMMUNITY): Payer: Self-pay | Admitting: Interventional Radiology

## 2014-07-20 DIAGNOSIS — C642 Malignant neoplasm of left kidney, except renal pelvis: Secondary | ICD-10-CM

## 2014-08-02 LAB — CREATININE WITH EST GFR
Creat: 1.07 mg/dL (ref 0.50–1.35)
GFR, Est African American: 72 mL/min
GFR, Est Non African American: 63 mL/min

## 2014-08-02 LAB — BUN: BUN: 19 mg/dL (ref 6–23)

## 2014-08-10 ENCOUNTER — Ambulatory Visit (HOSPITAL_COMMUNITY)
Admission: RE | Admit: 2014-08-10 | Discharge: 2014-08-10 | Disposition: A | Discharge status: Home / Self Care | Payer: Medicare Other | Source: Ambulatory Visit | Attending: Interventional Radiology | Admitting: Interventional Radiology

## 2014-08-10 ENCOUNTER — Ambulatory Visit
Admission: RE | Admit: 2014-08-10 | Discharge: 2014-08-10 | Disposition: A | Discharge status: Home / Self Care | Payer: Medicare Other | Source: Ambulatory Visit | Attending: Interventional Radiology | Admitting: Interventional Radiology

## 2014-08-10 ENCOUNTER — Encounter (HOSPITAL_COMMUNITY): Payer: Self-pay

## 2014-08-10 ENCOUNTER — Other Ambulatory Visit: Payer: Medicare Other

## 2014-08-10 DIAGNOSIS — Z08 Encounter for follow-up examination after completed treatment for malignant neoplasm: Secondary | ICD-10-CM | POA: Insufficient documentation

## 2014-08-10 DIAGNOSIS — C642 Malignant neoplasm of left kidney, except renal pelvis: Secondary | ICD-10-CM

## 2014-08-10 HISTORY — PX: IR GENERIC HISTORICAL: IMG1180011

## 2014-08-10 MED ORDER — IOHEXOL 300 MG/ML  SOLN
100.0000 mL | Freq: Once | INTRAMUSCULAR | Status: AC | PRN
  Administered 2014-08-10: 100 mL via INTRAVENOUS

## 2014-08-10 NOTE — Progress Notes (Signed)
Chief Complaint: Chief Complaint  Patient presents with  . Follow-up    2 yr follow up Cryoablation of Left Renal Papillary Carcinoma   History of Present Illness: Eric Foster is a 81 y.o. male status post percutaneous cryoablation of a left papillary renal carcinoma on 08/14/2012. The patient has been doing well over the last year. He has had no recurrent hematuria after treatment of bladder carcinoma. He denies any pain.  Past Medical History  Diagnosis Date  . Cancer 2005 or 2006    melanoma, on back/shoulder  . Hypertension   . Hypothyroidism   . Arthritis     Past Surgical History  Procedure Laterality Date  . Knee arthroscopy  1994  . Cardiac catheterization  01/09/98    with 2 stents  . Finger surgery      right pinky    Allergies: Ciprocinonide and Septra  Medications: Prior to Admission medications   Medication Sig Start Date End Date Taking? Authorizing Provider  aspirin 81 MG tablet Take 81 mg by mouth daily.   Yes Historical Provider, MD  atorvastatin (LIPITOR) 20 MG tablet Take 10 mg by mouth every morning.    Yes Historical Provider, MD  calcium carbonate (OS-CAL) 600 MG TABS Take 600 mg by mouth 2 (two) times daily with a meal.   Yes Historical Provider, MD  levothyroxine (SYNTHROID, LEVOTHROID) 100 MCG tablet Take 100 mcg by mouth every morning.    Yes Historical Provider, MD  meloxicam (MOBIC) 7.5 MG tablet Take 7.5 mg by mouth daily.   Yes Historical Provider, MD  metoprolol succinate (TOPROL-XL) 50 MG 24 hr tablet Take 25 mg by mouth every morning. Take with or immediately following a meal.   Yes Historical Provider, MD  pantoprazole (PROTONIX) 40 MG tablet Take 40 mg by mouth every morning.    Yes Historical Provider, MD  ramipril (ALTACE) 10 MG capsule Take 10 mg by mouth every morning.    Yes Historical Provider, MD  aspirin 325 MG EC tablet Take 81 mg by mouth every morning.     Historical Provider, MD  niacin (NIASPAN) 1000 MG CR tablet Take 1,500  mg by mouth at bedtime.    Historical Provider, MD    No family history on file.  History   Social History  . Marital Status: Married    Spouse Name: N/A    Number of Children: N/A  . Years of Education: N/A   Social History Main Topics  . Smoking status: Former Smoker -- 0.25 packs/day for 10 years    Types: Cigarettes, Pipe    Quit date: 07/22/1958  . Smokeless tobacco: Never Used  . Alcohol Use: Yes     Comment: rare  . Drug Use: No  . Sexual Activity: No   Other Topics Concern  . Not on file   Social History Narrative     Review of Systems: A 12 point ROS discussed and pertinent positives are indicated in the HPI above.  All other systems are negative.  Review of Systems  Constitutional: Negative.   Respiratory: Negative.   Cardiovascular: Negative.   Gastrointestinal: Negative.   Genitourinary: Negative.   Neurological: Negative.     Vital Signs: BP 105/75 mmHg  Pulse 68  Temp(Src) 97.9 F (36.6 C) (Oral)  Resp 12  SpO2 94%  Physical Exam  Constitutional: He appears well-developed and well-nourished. No distress.  Abdominal: Soft. He exhibits no distension. There is no tenderness.  Skin: He is not diaphoretic.  Imaging: Ct Abd Wo & W Cm  08/10/2014   CLINICAL DATA:  Followup for left papillary renal cell carcinoma. Two years status post cryoablation.  EXAM: CT ABDOMEN WITHOUT AND WITH CONTRAST  TECHNIQUE: Multidetector CT imaging of the abdomen was performed following the standard protocol before and following the bolus administration of intravenous contrast.  CONTRAST:  120mL OMNIPAQUE IOHEXOL 300 MG/ML  SOLN  COMPARISON:  08/10/13  FINDINGS: Lower chest:  Unremarkable.  Hepatobiliary: No masses or other significant abnormality identified.  Pancreas: No cystic or solid masses identified. No peripancreatic inflammatory changes or fluid collections demonstrated.  Spleen:  Within normal limits in size and appearance.  Adrenal Glands:  Stable small benign  left adrenal adenoma or cyst.  Kidneys: Cryoablation defect in the posterior midpole of the left kidney remains stable in appearance. No evidence of abnormal contrast enhancement or locally recurrent mass. Other small left renal cyst and several right renal cysts remain stable. No other renal masses or hydronephrosis identified.  Stomach/Bowel/Peritoneum: No evidence of wall thickening, mass, or obstruction involving visualized abdominal bowel. Diverticulosis again noted, without evidence of diverticulitis.  Vascular/Lymphatic: No pathologically enlarged lymph nodes identified. No other significant abnormality noted.  Other:  None.  Musculoskeletal:  No suspicious bone lesions identified.  IMPRESSION: Stable appearance of left renal cryoablation defect. No evidence of locally recurrent or metastatic carcinoma within the abdomen.   Electronically Signed   By: Earle Gell M.D.   On: 08/10/2014 12:40    Labs:  CBC: No results for input(s): WBC, HGB, HCT, PLT in the last 8760 hours.  COAGS: No results for input(s): INR, APTT in the last 8760 hours.  BMP:  Recent Labs  08/01/14 1041  BUN 19  CREATININE 1.07  GFRNONAA 63  GFRAA 72    LIVER FUNCTION TESTS: No results for input(s): BILITOT, AST, ALT, ALKPHOS, PROT, ALBUMIN in the last 8760 hours.  TUMOR MARKERS: No results for input(s): AFPTM, CEA, CA199, CHROMGRNA in the last 8760 hours.  Assessment and Plan:  I reviewed CT imaging from the follow-up CT performed today with Mr. Cranshaw and his wife. This demonstrates a stable avascular cryoablation defect of the posterolateral left kidney with no evidence of enhancing tissue. No new renal lesions are identified. Stable bilateral renal cysts.  No evidence of recurrent left renal papillary carcinoma 2 years post ablation. Renal function is stable and normal. I recommended another follow-up CT study in one year.  I spent a total of 20 minutes face to face in clinical consultation, greater than  50% of which was counseling/coordinating care for left renal ablation.   Venetia Night. Kathlene Cote, M.D. Pager:  [phone]     Signed: Aletta Edouard T 08/10/2014, 4:04 PM

## 2015-08-02 ENCOUNTER — Other Ambulatory Visit (HOSPITAL_COMMUNITY): Payer: Self-pay | Admitting: Interventional Radiology

## 2015-08-02 ENCOUNTER — Other Ambulatory Visit: Payer: Self-pay | Admitting: Radiology

## 2015-08-02 DIAGNOSIS — C642 Malignant neoplasm of left kidney, except renal pelvis: Secondary | ICD-10-CM

## 2015-08-08 LAB — CREATININE WITH EST GFR
CREATININE: 0.98 mg/dL (ref 0.70–1.11)
GFR, EST AFRICAN AMERICAN: 80 mL/min (ref >=60)
GFR, EST NON AFRICAN AMERICAN: 69 mL/min (ref >=60)

## 2015-08-08 LAB — BUN: BUN: 13 mg/dL (ref 7–25)

## 2015-08-29 ENCOUNTER — Ambulatory Visit (HOSPITAL_COMMUNITY)
Admission: RE | Admit: 2015-08-29 | Discharge: 2015-08-29 | Disposition: A | Discharge status: Home / Self Care | Payer: Medicare Other | Source: Ambulatory Visit | Attending: Interventional Radiology | Admitting: Interventional Radiology

## 2015-08-29 ENCOUNTER — Ambulatory Visit
Admission: RE | Admit: 2015-08-29 | Discharge: 2015-08-29 | Disposition: A | Discharge status: Home / Self Care | Payer: Medicare Other | Source: Ambulatory Visit | Attending: Interventional Radiology | Admitting: Interventional Radiology

## 2015-08-29 DIAGNOSIS — C642 Malignant neoplasm of left kidney, except renal pelvis: Secondary | ICD-10-CM

## 2015-08-29 DIAGNOSIS — R911 Solitary pulmonary nodule: Secondary | ICD-10-CM | POA: Diagnosis not present

## 2015-08-29 DIAGNOSIS — K5730 Diverticulosis of large intestine without perforation or abscess without bleeding: Secondary | ICD-10-CM | POA: Diagnosis not present

## 2015-08-29 DIAGNOSIS — M4856XA Collapsed vertebra, not elsewhere classified, lumbar region, initial encounter for fracture: Secondary | ICD-10-CM | POA: Diagnosis not present

## 2015-08-29 DIAGNOSIS — I2510 Atherosclerotic heart disease of native coronary artery without angina pectoris: Secondary | ICD-10-CM | POA: Diagnosis not present

## 2015-08-29 DIAGNOSIS — D3502 Benign neoplasm of left adrenal gland: Secondary | ICD-10-CM | POA: Diagnosis not present

## 2015-08-29 MED ORDER — IOHEXOL 300 MG/ML  SOLN
100.0000 mL | Freq: Once | INTRAMUSCULAR | Status: AC | PRN
  Administered 2015-08-29: 100 mL via INTRAVENOUS

## 2015-09-19 NOTE — Progress Notes (Signed)
Chief Complaint: Status post cryoablation of a left renal papillary carcinoma on 08/14/2012.  History of Present Illness: Eric Foster is a 82 y.o. male status post cryoablation of a biopsy-proven left renal papillary carcinoma in 2014. He has been doing well with no complaints.  Past Medical History  Diagnosis Date  . Cancer 2005 or 2006    melanoma, on back/shoulder  . Hypertension   . Hypothyroidism   . Arthritis     Past Surgical History  Procedure Laterality Date  . Knee arthroscopy  1994  . Cardiac catheterization  01/09/98    with 2 stents  . Finger surgery      right pinky    Allergies: Ciprocinonide and Septra  Medications: Prior to Admission medications   Medication Sig Start Date End Date Taking? Authorizing Provider  aspirin 81 MG tablet Take 81 mg by mouth daily.   Yes Historical Provider, MD  atorvastatin (LIPITOR) 20 MG tablet Take 10 mg by mouth every morning.    Yes Historical Provider, MD  calcium carbonate (OS-CAL) 600 MG TABS Take 600 mg by mouth 2 (two) times daily with a meal.   Yes Historical Provider, MD  levothyroxine (SYNTHROID, LEVOTHROID) 100 MCG tablet Take 100 mcg by mouth every morning.    Yes Historical Provider, MD  metoprolol succinate (TOPROL-XL) 50 MG 24 hr tablet Take 25 mg by mouth every morning. Take with or immediately following a meal.   Yes Historical Provider, MD  niacin (NIASPAN) 1000 MG CR tablet Take 1,500 mg by mouth at bedtime.   Yes Historical Provider, MD  pantoprazole (PROTONIX) 40 MG tablet Take 40 mg by mouth every morning.    Yes Historical Provider, MD  ramipril (ALTACE) 10 MG capsule Take 10 mg by mouth every morning.    Yes Historical Provider, MD  sertraline (ZOLOFT) 25 MG tablet Take 25 mg by mouth daily.   Yes Historical Provider, MD  aspirin 325 MG EC tablet Take 81 mg by mouth every morning. Reported on 08/29/2015    Historical Provider, MD  meloxicam (MOBIC) 7.5 MG tablet Take 7.5 mg by mouth daily. Reported  on 08/29/2015    Historical Provider, MD     No family history on file.  Social History   Social History  . Marital Status: Married    Spouse Name: N/A  . Number of Children: N/A  . Years of Education: N/A   Social History Main Topics  . Smoking status: Former Smoker -- 0.25 packs/day for 10 years    Types: Cigarettes, Pipe    Quit date: 07/22/1958  . Smokeless tobacco: Never Used  . Alcohol Use: Yes     Comment: rare  . Drug Use: No  . Sexual Activity: No   Other Topics Concern  . Not on file   Social History Narrative     Review of Systems: A 12 point ROS discussed and pertinent positives are indicated in the HPI above.  All other systems are negative.  Review of Systems  Constitutional: Negative.   Respiratory: Negative.   Cardiovascular: Negative.   Gastrointestinal: Negative.   Genitourinary: Negative.   Musculoskeletal: Negative.   Neurological: Negative.     Vital Signs: BP 102/62 mmHg  Pulse 71  Temp(Src) 98 F (36.7 C) (Oral)  Resp 14  Ht 6' 2"  (1.88 m)  Wt 212 lb (96.163 kg)  BMI 27.21 kg/m2  SpO2 92%  Physical Exam  Constitutional: He is oriented to person, place, and time. He appears  well-developed. No distress.  Abdominal: Soft. He exhibits no distension. There is no tenderness. There is no rebound and no guarding.  Neurological: He is alert and oriented to person, place, and time.  Skin: He is not diaphoretic.  Nursing note and vitals reviewed.   Imaging: Ct Abd Wo & W Cm  08/29/2015  CLINICAL DATA:  Three year follow-up status post cryoablation for papillary left renal cell carcinoma performed on 08/14/2012. EXAM: CT ABDOMEN WITHOUT AND WITH CONTRAST TECHNIQUE: Multidetector CT imaging of the abdomen was performed following the standard protocol before and following the bolus administration of intravenous contrast. CONTRAST:  139m OMNIPAQUE IOHEXOL 300 MG/ML  SOLN COMPARISON:  08/10/2014 CT abdomen. FINDINGS: Lower chest: Anterior left upper  lobe 4 mm pulmonary nodule (series 7/ image 11), not previously imaged (this portion of the lungs was not imaged on any of the prior available CT abdomen studies). Stable subsegmental bilateral lower lobe scarring versus atelectasis. Coronary atherosclerosis and aortic valvular calcifications. Hepatobiliary: Normal liver with no liver mass. Normal gallbladder with no radiopaque cholelithiasis. No biliary ductal dilatation. Pancreas: Normal, with no mass or duct dilation. Spleen: Normal size. No mass. Adrenals/Urinary Tract: Stable 1.8 cm left adrenal adenoma. Normal right adrenal. No renal stones. No hydronephrosis. Simple right renal cysts measuring up to 9.2 cm in the posterior upper right kidney. Simple 1.5 cm renal cyst in the posterior interpolar left kidney. Additional subcentimeter hypodense renal cortical lesions in both kidneys are too small to characterize and are not appreciably changed, suggesting benign tiny renal cysts. There is a stable 2.5 x 2.2 cm simple fluid density mass with thin peripheral calcification at the ablation site in the posterior interpolar left kidney without appreciable enhancement, in keeping with treated tumor. Stomach/Bowel: Grossly normal stomach. Visualized small and large bowel is normal caliber, with no bowel wall thickening. Normal appendix. Distal colonic diverticulosis. Vascular/Lymphatic: Atherosclerotic nonaneurysmal abdominal aorta. Patent portal, splenic, hepatic and renal veins. No pathologically enlarged lymph nodes in the abdomen. Other: No pneumoperitoneum, ascites or focal fluid collection. Musculoskeletal: No aggressive appearing focal osseous lesions. There is a mild-to-moderate compression fracture of the T11 vertebral body, which appears new since the 06/01/2015 lumbar spine radiographs. Stable mild chronic L3 vertebral compression fracture. Marked degenerative changes in the visualized thoracolumbar spine. IMPRESSION: 1. Stable treated tumor at the ablation  site in the interpolar left kidney, with no evidence of local tumor recurrence. 2. No evidence of metastatic disease in the abdomen. 3. Newly imaged 4 mm left upper lobe pulmonary nodule. Consider unenhanced chest CT follow-up in 3 months as clinically warranted. 4. Mild to moderate T11 vertebral compression fracture, new since 06/01/2015. Stable mild chronic L3 vertebral compression fracture. 5. Additional findings include coronary atherosclerosis, left adrenal adenoma and distal colonic diverticulosis. These results were called by telephone at the time of interpretation on 08/29/2015 at 12:35 pm to Dr. GAletta Edouard, who verbally acknowledged these results. Electronically Signed   By: JIlona SorrelM.D.   On: 08/29/2015 12:36    Labs:  CBC: No results for input(s): WBC, HGB, HCT, PLT in the last 8760 hours.  COAGS: No results for input(s): INR, APTT in the last 8760 hours.  BMP:  Recent Labs  08/07/15 1041  BUN 13  CREATININE 0.98  GFRNONAA 69  GFRAA 80    LIVER FUNCTION TESTS: No results for input(s): BILITOT, AST, ALT, ALKPHOS, PROT, ALBUMIN in the last 8760 hours.  TUMOR MARKERS: No results for input(s): AFPTM, CEA, CA199, CHROMGRNA in the last  8760 hours.  Assessment and Plan:  I met with Mr. Rosevear and reviewed the follow-up CT imaging with him which shows no evidence of tumor recurrence at the site of left renal cryoablation. Note was made of an incidental tiny nodular region in the anterior subpleural left upper lobe/lingula. Based on my review, this likely represents an area of focal scarring. I recommended that we obtain a CT of the chest at the time of follow-up CT of the abdomen in one year.  Electronically SignedAletta Edouard T  09/19/2015, 8:18 AM   I spent a total of 15 Minutes in face to face in clinical consultation, greater than 50% of which was counseling/coordinating care post left renal cryoablation.

## 2015-10-07 IMAGING — CT CT ABDOMEN WO/W CM
3 of 13 series · 13 of 46 positions shown, 19 images · IV contrast (OMNIPAQUE)
Comparison: 08/10/13

CLINICAL DATA: Followup for left papillary renal cell carcinoma.
Two years status post cryoablation.

EXAM:
CT ABDOMEN WITHOUT AND WITH CONTRAST
TECHNIQUE: Multidetector CT imaging of the abdomen was performed following the
standard protocol before and following the bolus administration of
intravenous contrast.
CONTRAST:  100mL OMNIPAQUE IOHEXOL 300 MG/ML  SOLN

[Series 2: non contrast 3.0 b30f · axial · 0.84mm/px · z∈[+1088,+1298]mm · 6 of 98 slices shown]
[im 14/98  soft-tissue]
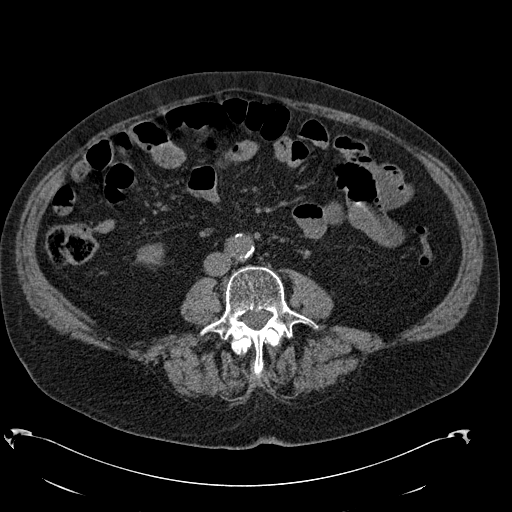
[im 28/98  soft-tissue]
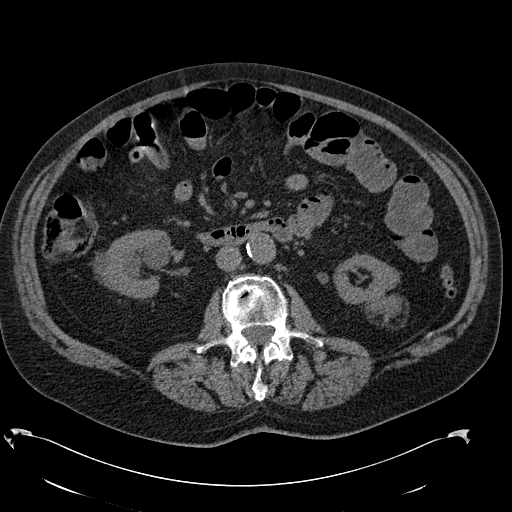
[im 42/98  soft-tissue]
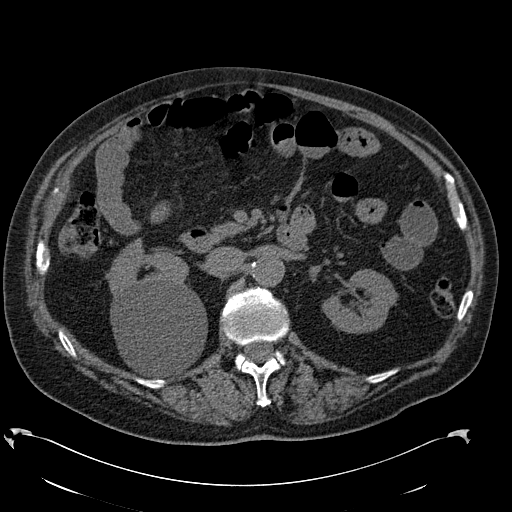
[im 56/98  soft-tissue]
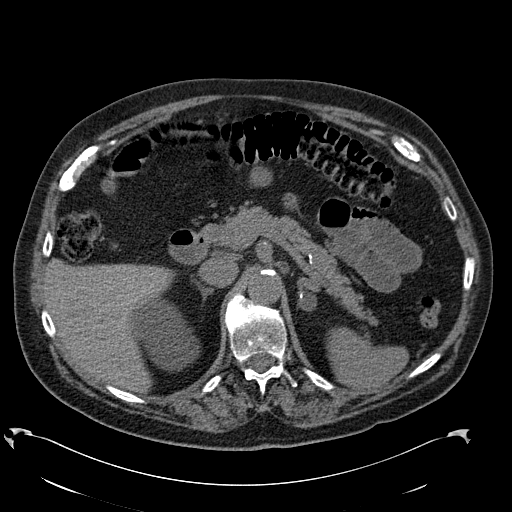
[im 70/98  soft-tissue]
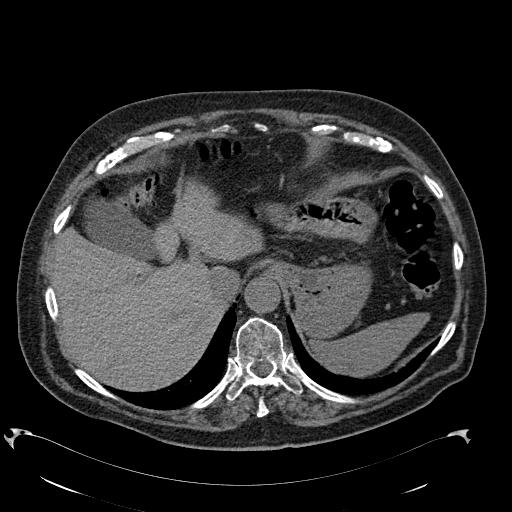
[im 84/98  soft-tissue]
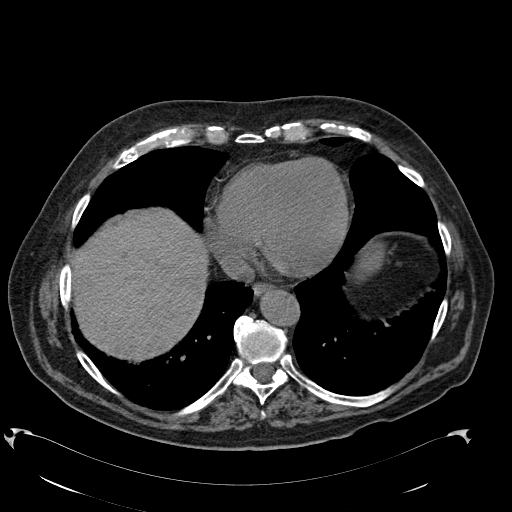

[Series 6: venous · axial · portal-venous · 0.86mm/px · z∈[+1088,+1298]mm · 6 of 98 slices shown, 11 images]
[im 14/98  soft-tissue]
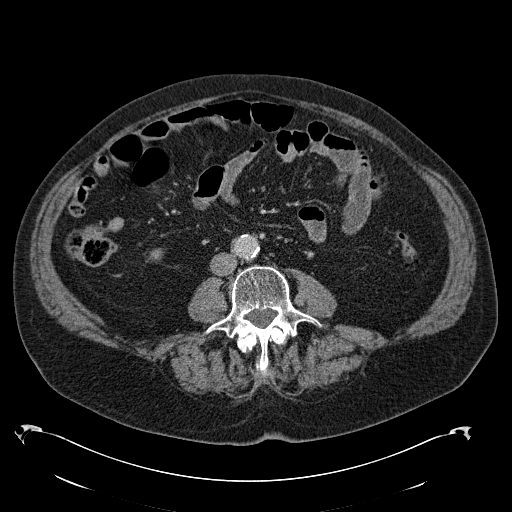
[im 14/98  bone]
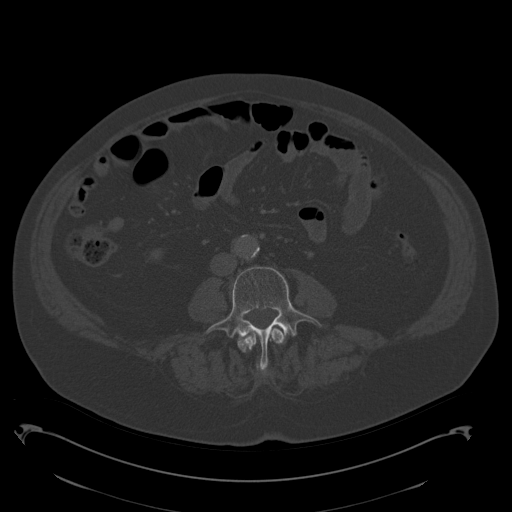
[im 28/98  soft-tissue]
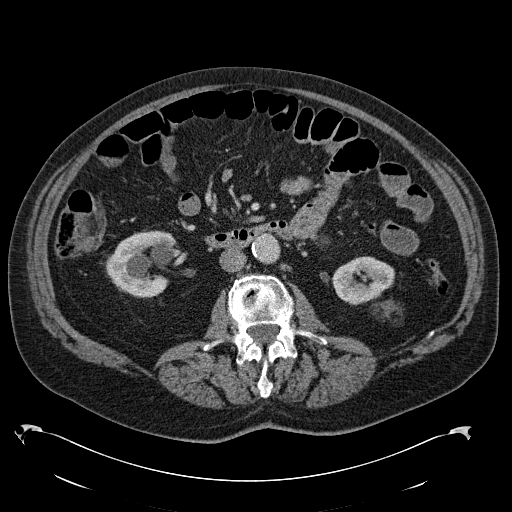
[im 42/98  soft-tissue]
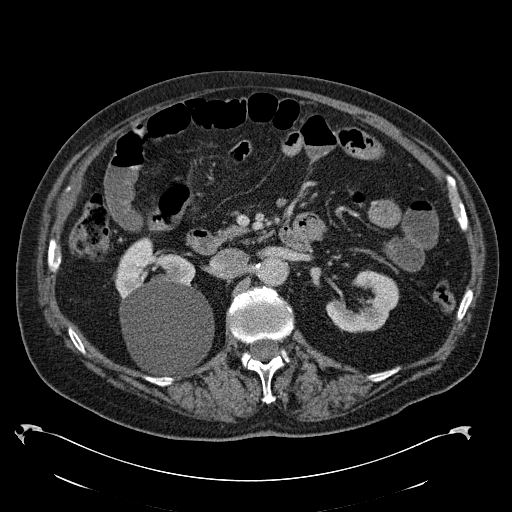
[im 42/98  lung]
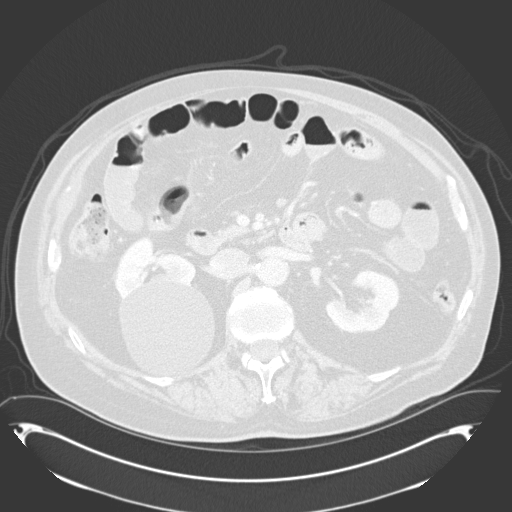
[im 56/98  soft-tissue]
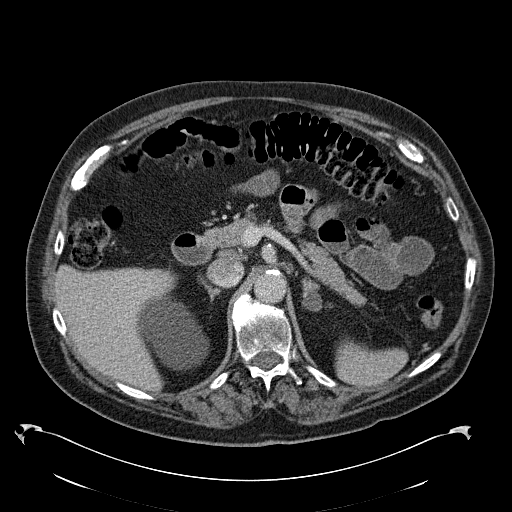
[im 56/98  lung]
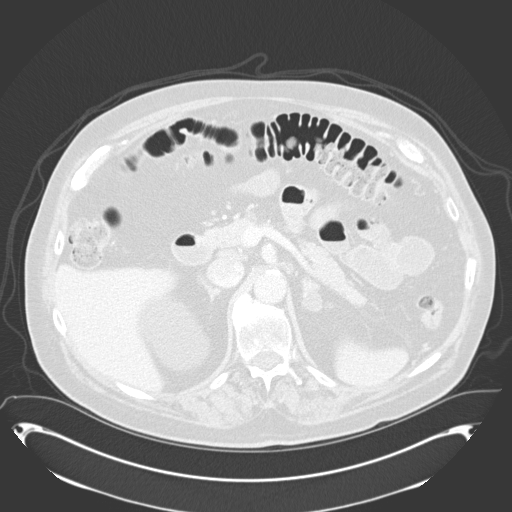
[im 70/98  soft-tissue]
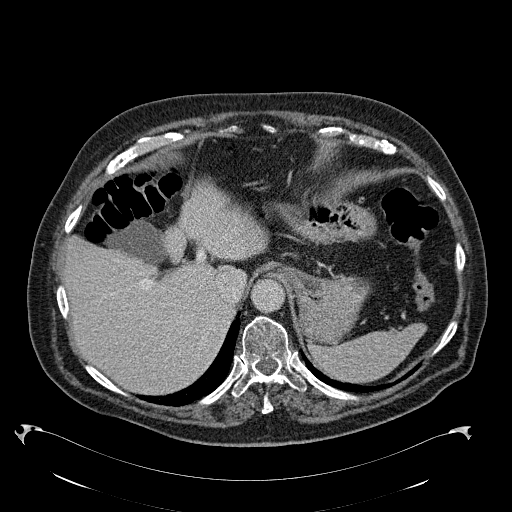
[im 70/98  lung]
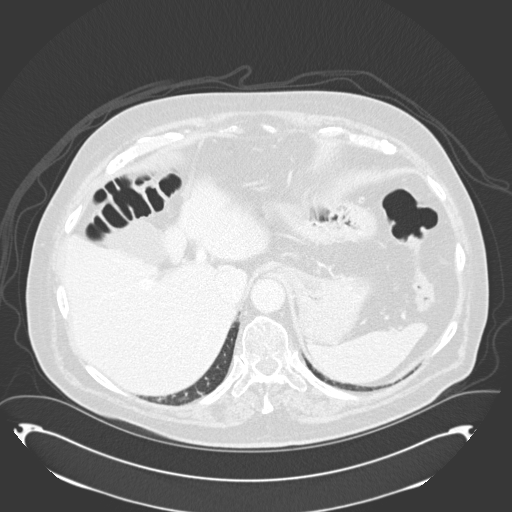
[im 84/98  soft-tissue]
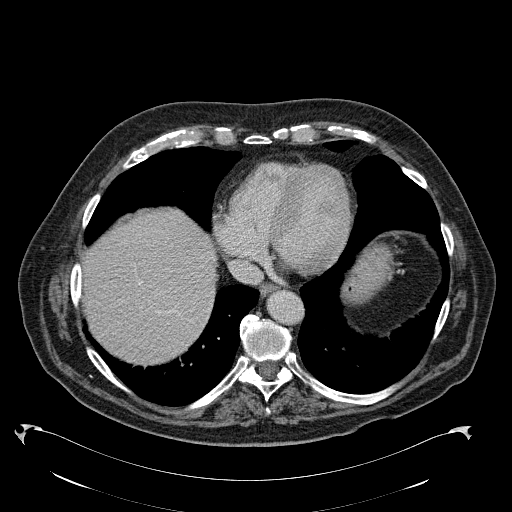
[im 84/98  lung]
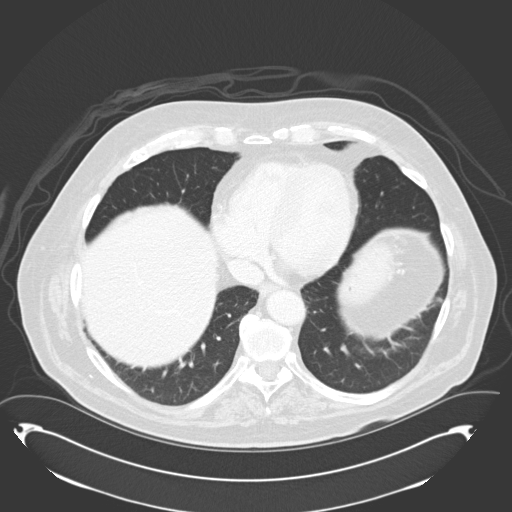

[Series 613: <mpr thick range(4)> · coronal · 0.84mm/px · 1 of 105 slices shown, 2 images]
[im 53/105  soft-tissue]
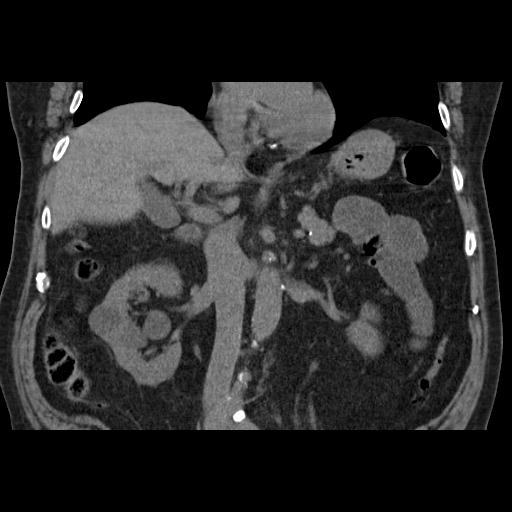
[im 53/105  bone]
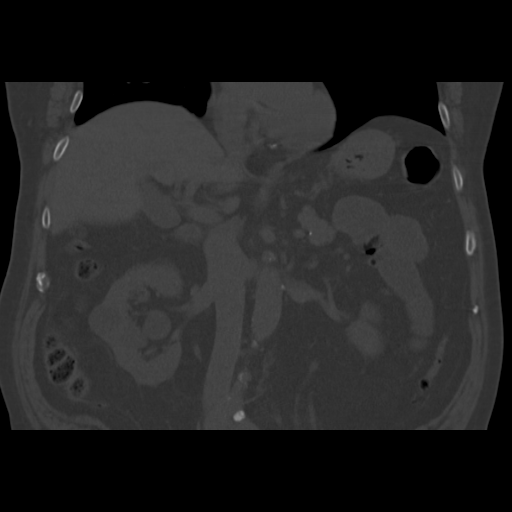

[13 of 46 positions shown; findings below may reference images not displayed]

FINDINGS: Lower chest:  Unremarkable.

Hepatobiliary: No masses or other significant abnormality
identified.

Pancreas: No cystic or solid masses identified. No peripancreatic
inflammatory changes or fluid collections demonstrated.

Spleen:  Within normal limits in size and appearance.

Adrenal Glands:  Stable small benign left adrenal adenoma or cyst.

Kidneys: Cryoablation defect in the posterior midpole of the left
kidney remains stable in appearance. No evidence of abnormal
contrast enhancement or locally recurrent mass. Other small left
renal cyst and several right renal cysts remain stable. No other
renal masses or hydronephrosis identified.

Stomach/Bowel/Peritoneum: No evidence of wall thickening, mass, or
obstruction involving visualized abdominal bowel. Diverticulosis
again noted, without evidence of diverticulitis.

Vascular/Lymphatic: No pathologically enlarged lymph nodes
identified. No other significant abnormality noted.

Other:  None.

Musculoskeletal:  No suspicious bone lesions identified.
IMPRESSION: Stable appearance of left renal cryoablation defect. No evidence of
locally recurrent or metastatic carcinoma within the abdomen.

## 2016-07-24 ENCOUNTER — Encounter: Payer: Self-pay | Admitting: Interventional Radiology

## 2016-08-27 ENCOUNTER — Other Ambulatory Visit: Payer: Self-pay | Admitting: Radiology

## 2016-08-27 ENCOUNTER — Other Ambulatory Visit (HOSPITAL_COMMUNITY): Payer: Self-pay | Admitting: Interventional Radiology

## 2016-08-27 DIAGNOSIS — C642 Malignant neoplasm of left kidney, except renal pelvis: Secondary | ICD-10-CM

## 2016-08-28 ENCOUNTER — Other Ambulatory Visit: Payer: Self-pay | Admitting: *Deleted

## 2016-08-28 DIAGNOSIS — C642 Malignant neoplasm of left kidney, except renal pelvis: Secondary | ICD-10-CM

## 2016-09-03 ENCOUNTER — Ambulatory Visit (HOSPITAL_COMMUNITY)
Admission: RE | Admit: 2016-09-03 | Discharge: 2016-09-03 | Disposition: A | Discharge status: Home / Self Care | Payer: Medicare Other | Source: Ambulatory Visit | Attending: Interventional Radiology | Admitting: Interventional Radiology

## 2016-09-03 ENCOUNTER — Other Ambulatory Visit: Payer: Medicare Other

## 2016-09-03 ENCOUNTER — Encounter (HOSPITAL_COMMUNITY): Payer: Self-pay

## 2016-09-03 ENCOUNTER — Ambulatory Visit
Admission: RE | Admit: 2016-09-03 | Discharge: 2016-09-03 | Disposition: A | Discharge status: Home / Self Care | Payer: Medicare Other | Source: Ambulatory Visit | Attending: Interventional Radiology | Admitting: Interventional Radiology

## 2016-09-03 DIAGNOSIS — I2510 Atherosclerotic heart disease of native coronary artery without angina pectoris: Secondary | ICD-10-CM | POA: Diagnosis not present

## 2016-09-03 DIAGNOSIS — S2222XA Fracture of body of sternum, initial encounter for closed fracture: Secondary | ICD-10-CM | POA: Diagnosis not present

## 2016-09-03 DIAGNOSIS — X58XXXA Exposure to other specified factors, initial encounter: Secondary | ICD-10-CM | POA: Diagnosis not present

## 2016-09-03 DIAGNOSIS — Z9889 Other specified postprocedural states: Secondary | ICD-10-CM | POA: Diagnosis not present

## 2016-09-03 DIAGNOSIS — C642 Malignant neoplasm of left kidney, except renal pelvis: Secondary | ICD-10-CM | POA: Diagnosis not present

## 2016-09-03 DIAGNOSIS — S22059A Unspecified fracture of T5-T6 vertebra, initial encounter for closed fracture: Secondary | ICD-10-CM | POA: Diagnosis not present

## 2016-09-03 DIAGNOSIS — R911 Solitary pulmonary nodule: Secondary | ICD-10-CM | POA: Diagnosis not present

## 2016-09-03 DIAGNOSIS — N281 Cyst of kidney, acquired: Secondary | ICD-10-CM | POA: Diagnosis not present

## 2016-09-03 DIAGNOSIS — M4856XA Collapsed vertebra, not elsewhere classified, lumbar region, initial encounter for fracture: Secondary | ICD-10-CM | POA: Diagnosis not present

## 2016-09-03 DIAGNOSIS — I700 Atherosclerosis of aorta: Secondary | ICD-10-CM | POA: Diagnosis not present

## 2016-09-03 DIAGNOSIS — K5730 Diverticulosis of large intestine without perforation or abscess without bleeding: Secondary | ICD-10-CM | POA: Insufficient documentation

## 2016-09-03 DIAGNOSIS — D71 Functional disorders of polymorphonuclear neutrophils: Secondary | ICD-10-CM | POA: Insufficient documentation

## 2016-09-03 HISTORY — PX: IR GENERIC HISTORICAL: IMG1180011

## 2016-09-03 LAB — POCT I-STAT CREATININE: Creatinine, Ser: 1 mg/dL (ref 0.61–1.24)

## 2016-09-03 MED ORDER — SODIUM CHLORIDE 0.9 % IJ SOLN
INTRAMUSCULAR | Status: AC
  Filled 2016-09-03: qty 50

## 2016-09-03 MED ORDER — IOPAMIDOL (ISOVUE-300) INJECTION 61%
INTRAVENOUS | Status: AC
  Filled 2016-09-03: qty 100

## 2016-09-03 MED ORDER — IOPAMIDOL (ISOVUE-300) INJECTION 61%
100.0000 mL | Freq: Once | INTRAVENOUS | Status: AC | PRN
  Administered 2016-09-03: 100 mL via INTRAVENOUS

## 2016-09-03 NOTE — Progress Notes (Signed)
Chief Complaint: Patient was seen in consultation today for  Chief Complaint  Patient presents with  . Follow-up    4  yr follow up Cryoablation of Left Renal Papillary Carcinoma    Referring Physician(s): Eric Foster  Supervising Physician: Eric Foster  History of Present Illness: Eric Foster is a 83 y.o. male status post cryoablation of a biopsy-proven left renal papillary carcinoma in 2014.   He is here today after follow up CT scan  He has been doing well with no complaints  Past Medical History:  Diagnosis Date  . Arthritis   . Cancer ([place]) 2005 or 2006   melanoma, on back/shoulder  . Hypertension   . Hypothyroidism     Past Surgical History:  Procedure Laterality Date  . CARDIAC CATHETERIZATION  01/09/98   with 2 stents  . FINGER SURGERY     right pinky  . IR GENERIC HISTORICAL  08/10/2014   IR RADIOLOGIST EVAL & MGMT 08/10/2014 Eric Edouard, MD [facility] INTERV RAD  . KNEE ARTHROSCOPY  1994    Allergies: Ciprocinonide [fluocinolone] and Septra [sulfamethoxazole-trimethoprim]  Medications: Prior to Admission medications   Medication Sig Start Date End Date Taking? Authorizing Provider  aspirin 325 MG EC tablet Take 81 mg by mouth every morning. Reported on 08/29/2015    Historical Provider, MD  aspirin 81 MG tablet Take 81 mg by mouth daily.    Historical Provider, MD  atorvastatin (LIPITOR) 20 MG tablet Take 10 mg by mouth every morning.     Historical Provider, MD  calcium carbonate (OS-CAL) 600 MG TABS Take 600 mg by mouth 2 (two) times daily with a meal.    Historical Provider, MD  levothyroxine (SYNTHROID, LEVOTHROID) 100 MCG tablet Take 100 mcg by mouth every morning.     Historical Provider, MD  meloxicam (MOBIC) 7.5 MG tablet Take 7.5 mg by mouth daily. Reported on 08/29/2015    Historical Provider, MD  metoprolol succinate (TOPROL-XL) 50 MG 24 hr tablet Take 25 mg by mouth every morning. Take with or immediately following a meal.    Historical  Provider, MD  niacin (NIASPAN) 1000 MG CR tablet Take 1,500 mg by mouth at bedtime.    Historical Provider, MD  pantoprazole (PROTONIX) 40 MG tablet Take 40 mg by mouth every morning.     Historical Provider, MD  ramipril (ALTACE) 10 MG capsule Take 10 mg by mouth every morning.     Historical Provider, MD  sertraline (ZOLOFT) 25 MG tablet Take 25 mg by mouth daily.    Historical Provider, MD     No family history on file.  Social History   Social History  . Marital status: Married    Spouse name: N/A  . Number of children: N/A  . Years of education: N/A   Social History Main Topics  . Smoking status: Former Smoker    Packs/day: 0.25    Years: 10.00    Types: Cigarettes, Pipe    Quit date: 07/22/1958  . Smokeless tobacco: Never Used  . Alcohol use Yes     Comment: rare  . Drug use: No  . Sexual activity: No   Other Topics Concern  . Not on file   Social History Narrative  . No narrative on file     Review of Systems: A 12 point ROS discussed  Review of Systems  Constitutional: Negative.   HENT: Negative.   Respiratory: Negative.   Cardiovascular: Negative.   Gastrointestinal: Negative.   Genitourinary: Negative.  Musculoskeletal: Negative.   Skin: Negative.   Neurological: Negative.   Hematological: Negative.   Psychiatric/Behavioral: Negative.     Vital Signs: Ht 6\' 1"  (1.854 m)   Wt 210 lb (95.3 kg)   BMI 27.71 kg/m   Physical Exam  Constitutional: He is oriented to person, place, and time. He appears well-developed and well-nourished.  HENT:  Head: Normocephalic and atraumatic.  Eyes: EOM are normal.  Neck: Normal range of motion.  Cardiovascular: Normal rate, regular rhythm and normal heart sounds.   Pulmonary/Chest: Effort normal and breath sounds normal. No respiratory distress. He has no wheezes.  Abdominal: Soft. He exhibits no distension. There is no tenderness.  Musculoskeletal: Normal range of motion.  Neurological: He is alert and  oriented to person, place, and time.  Skin: Skin is warm.  Psychiatric: He has a normal mood and affect. His behavior is normal. Judgment and thought content normal.  Vitals reviewed.    Imaging: No results found.  Labs:  CBC: No results for input(s): WBC, HGB, HCT, PLT in the last 8760 hours.  COAGS: No results for input(s): INR, APTT in the last 8760 hours.  BMP:  Recent Labs  09/03/16 1339  CREATININE 1.00    LIVER FUNCTION TESTS: No results for input(s): BILITOT, AST, ALT, ALKPHOS, PROT, ALBUMIN in the last 8760 hours.  TUMOR MARKERS: No results for input(s): AFPTM, CEA, CA199, CHROMGRNA in the last 8760 hours.  Assessment:  Status post cryoablation of a biopsy-proven left renal papillary carcinoma in 2014.   CT scan reviewed by Dr. Kathlene Foster. No evidence of recurrence.  Return in 1 year with CT scan   Electronically Signed: Murrell Redden PA-C 09/03/2016, 3:31 PM   Please refer to Dr. Margaretmary Foster attestation of this note for management and plan.

## 2016-09-05 ENCOUNTER — Encounter: Payer: Self-pay | Admitting: *Deleted

## 2017-10-01 ENCOUNTER — Other Ambulatory Visit (HOSPITAL_COMMUNITY): Payer: Self-pay | Admitting: Interventional Radiology

## 2017-10-01 ENCOUNTER — Other Ambulatory Visit: Payer: Self-pay | Admitting: Radiology

## 2017-10-01 DIAGNOSIS — C642 Malignant neoplasm of left kidney, except renal pelvis: Secondary | ICD-10-CM

## 2017-10-15 ENCOUNTER — Encounter (HOSPITAL_COMMUNITY): Payer: Self-pay

## 2017-10-15 ENCOUNTER — Ambulatory Visit (HOSPITAL_COMMUNITY)
Admission: RE | Admit: 2017-10-15 | Discharge: 2017-10-15 | Disposition: A | Discharge status: Home / Self Care | Payer: Medicare Other | Source: Ambulatory Visit | Attending: Interventional Radiology | Admitting: Interventional Radiology

## 2017-10-15 ENCOUNTER — Ambulatory Visit (HOSPITAL_COMMUNITY): Admission: RE | Admit: 2017-10-15 | Payer: Medicare Other | Source: Ambulatory Visit

## 2017-10-15 ENCOUNTER — Ambulatory Visit
Admission: RE | Admit: 2017-10-15 | Discharge: 2017-10-15 | Disposition: A | Discharge status: Home / Self Care | Payer: Medicare Other | Source: Ambulatory Visit | Attending: Interventional Radiology | Admitting: Interventional Radiology

## 2017-10-15 DIAGNOSIS — I700 Atherosclerosis of aorta: Secondary | ICD-10-CM | POA: Diagnosis not present

## 2017-10-15 DIAGNOSIS — C642 Malignant neoplasm of left kidney, except renal pelvis: Secondary | ICD-10-CM

## 2017-10-15 DIAGNOSIS — N281 Cyst of kidney, acquired: Secondary | ICD-10-CM | POA: Insufficient documentation

## 2017-10-15 DIAGNOSIS — Z8781 Personal history of (healed) traumatic fracture: Secondary | ICD-10-CM | POA: Diagnosis not present

## 2017-10-15 DIAGNOSIS — R918 Other nonspecific abnormal finding of lung field: Secondary | ICD-10-CM | POA: Diagnosis not present

## 2017-10-15 DIAGNOSIS — D3502 Benign neoplasm of left adrenal gland: Secondary | ICD-10-CM | POA: Diagnosis not present

## 2017-10-15 HISTORY — PX: IR RADIOLOGIST EVAL & MGMT: IMG5224

## 2017-10-15 LAB — POCT I-STAT CREATININE: Creatinine, Ser: 1.2 mg/dL (ref 0.61–1.24)

## 2017-10-15 MED ORDER — IOPAMIDOL (ISOVUE-300) INJECTION 61%
100.0000 mL | Freq: Once | INTRAVENOUS | Status: AC | PRN
  Administered 2017-10-15: 100 mL via INTRAVENOUS

## 2017-10-15 MED ORDER — IOPAMIDOL (ISOVUE-300) INJECTION 61%
INTRAVENOUS | Status: AC
  Filled 2017-10-15: qty 100

## 2017-10-15 NOTE — Progress Notes (Signed)
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  History of Present Illness: Eric Foster is a 84 y.o. 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  He has been doing well.  In the year since his prior follow-up, he is status post placement of a pacemaker as well as transcatheter aortic valve replacement.  Both procedures went well and Eric Foster states that he feels better after the procedures.  He is asymptomatic.  Past Medical History:  Diagnosis Date  . Arthritis   . Cancer ([place]) 2005 or 2006   melanoma, on back/shoulder  . Hypertension   . Hypothyroidism     Past Surgical History:  Procedure Laterality Date  . CARDIAC CATHETERIZATION  01/09/98   with 2 stents  . FINGER SURGERY     right pinky  . IR GENERIC HISTORICAL  08/10/2014   IR RADIOLOGIST EVAL & MGMT 08/10/2014 Aletta Edouard, MD [facility] INTERV RAD  . IR GENERIC HISTORICAL  09/03/2016   IR RADIOLOGIST EVAL & MGMT 09/03/2016 Aletta Edouard, MD [facility] INTERV RAD  . IR RADIOLOGIST EVAL & MGMT  10/15/2017  . KNEE ARTHROSCOPY  1994    Allergies: Ciprocinonide [fluocinolone] and Septra [sulfamethoxazole-trimethoprim]  Medications: Prior to Admission medications   Medication Sig Start Date End Date Taking? Authorizing Provider  aspirin 81 MG tablet Take 81 mg by mouth daily.   Yes [provider]  atorvastatin (LIPITOR) 20 MG tablet Take 10 mg by mouth every morning.    Yes [provider]  calcium carbonate (OS-CAL) 600 MG TABS Take 600 mg by mouth 2 (two) times daily with a meal.   Yes [provider]  isosorbide mononitrate (IMDUR) 60 MG 24 hr tablet Take 60 mg by mouth daily.   Yes [provider]  levothyroxine (SYNTHROID, LEVOTHROID) 100 MCG tablet Take 100 mcg by mouth every morning.    Yes [provider]  pantoprazole (PROTONIX) 40 MG tablet Take 40 mg by mouth every morning.     Yes [provider]  Pantothenic Acid 500 MG TABS Take 1 tablet by mouth daily.   Yes [provider]  ranolazine (RANEXA) 1000 MG SR tablet Take 500 mg by mouth 2 (two) times daily.   Yes [provider]  sertraline (ZOLOFT) 25 MG tablet Take 25 mg by mouth daily.   Yes [provider]  aspirin 325 MG EC tablet Take 81 mg by mouth every morning. Reported on 08/29/2015    [provider]  meloxicam (MOBIC) 7.5 MG tablet Take 7.5 mg by mouth daily. Reported on 08/29/2015    [provider]  metoprolol succinate (TOPROL-XL) 50 MG 24 hr tablet Take 25 mg by mouth every morning. Take with or immediately following a meal.    [provider]  niacin (NIASPAN) 1000 MG CR tablet Take 1,500 mg by mouth at bedtime.    [provider]  ramipril (ALTACE) 10 MG capsule Take 10 mg by mouth every morning.     [provider]     No family history on file.  Social History   Socioeconomic History  . Marital status: Married    Spouse name: Not on file  . Number of children: Not on file  . Years of education: Not on file  . Highest education level: Not on file  Occupational History  . Not on file  Social Needs  . Financial resource strain: Not on  file  . Food insecurity:    Worry: Not on file    Inability: Not on file  . Transportation needs:    Medical: Not on file    Non-medical: Not on file  Tobacco Use  . Smoking status: Former Smoker    Packs/day: 0.25    Years: 10.00    Pack years: 2.50    Types: Cigarettes, Pipe    Last attempt to quit: 07/22/1958    Years since quitting: 59.2  . Smokeless tobacco: Never Used  Substance and Sexual Activity  . Alcohol use: Yes    Comment: rare  . Drug use: No  . Sexual activity: Never  Lifestyle  . Physical activity:    Days per week: Not on file    Minutes per session: Not on file  . Stress: Not on file  Relationships  . Social connections:    Talks on phone: Not on  file    Gets together: Not on file    Attends religious service: Not on file    Active member of club or organization: Not on file    Attends meetings of clubs or organizations: Not on file    Relationship status: Not on file  Other Topics Concern  . Not on file  Social History Narrative  . Not on file    ECOG Status: 0 - Asymptomatic  Review of Systems: A 12 point ROS discussed and pertinent positives are indicated in the HPI above.  All other systems are negative.  Review of Systems  Constitutional: Negative.   Respiratory: Negative.   Cardiovascular: Negative.   Gastrointestinal: Negative.   Genitourinary: Negative.   Musculoskeletal: Negative.   Neurological: Negative.     Vital Signs: BP 98/72   Pulse 78   Temp 97.7 F (36.5 C) (Oral)   Ht 6' (1.829 m)   Wt 191 lb (86.6 kg)   SpO2 95%   BMI 25.90 kg/m   Physical Exam  Constitutional: He is oriented to person, place, and time. He appears well-developed and well-nourished. No distress.  Abdominal: Soft. He exhibits no distension. There is no tenderness.  Musculoskeletal: He exhibits no edema.  Neurological: He is alert and oriented to person, place, and time.  Skin: Skin is warm and dry. He is not diaphoretic.  Vitals reviewed.   Imaging: Ct Chest W Contrast  Result Date: 10/15/2017 CLINICAL DATA:  Left renal cryoablation for papillary carcinoma. EXAM: CT CHEST WITH CONTRAST CT ABDOMEN WITHOUT AND WITH CONTRAST TECHNIQUE: Multidetector CT imaging of the abdomen was performed without intravenous contrast. Multidetector CT imaging of the chest and abdomen was then performed during bolus administration of intravenous contrast. CONTRAST:  139mL ISOVUE-300 IOPAMIDOL (ISOVUE-300) INJECTION 61% COMPARISON:  09/03/2016. FINDINGS: CT CHEST WITH CONTRAST Cardiovascular: The heart size is normal. No pericardial effusion. Coronary artery calcification is evident. Status post aortic valve replacement. Permanent pacemaker  noted. Mediastinum/Nodes: No mediastinal lymphadenopathy. Scattered small calcified mediastinal and left hilar lymph nodes are stable. The esophagus has normal imaging features. There is no axillary lymphadenopathy. Lungs/Pleura: Biapical pleuroparenchymal scarring is stable. Tiny posterior right upper lobe ground-glass nodule seen previously has resolved in the interval. 10 mm peripheral right upper lobe ground-glass nodule seen on the previous study has resolved. 5 mm ground-glass nodule anterior left upper lobe is unchanged. Bibasilar chronic atelectasis or scarring seen previously is similar in the dependent lower lobes. No new pulmonary nodule or mass. No pleural effusion. Musculoskeletal: Bone windows reveal no worrisome lytic or sclerotic osseous lesions.  Superior endplate compression deformity is evident at T5, stable. T11 compression fracture also has similar imaging features. Healing sternal fracture again noted. CT ABDOMEN WITHOUT AND WITH CONTRAST Hepatobiliary: No focal abnormality within the liver parenchyma. There is no evidence for gallstones, gallbladder wall thickening, or pericholecystic fluid. No intrahepatic or extrahepatic biliary dilation. Pancreas: No focal mass lesion. No dilatation of the main duct. No intraparenchymal cyst. No peripancreatic edema. Spleen: No splenomegaly. No focal mass lesion. Adrenals/Urinary Tract: Right adrenal gland unremarkable. Stable 19 mm homogeneous low-attenuation left adrenal nodule with average attenuation of 7 Hounsfield units on precontrast imaging. Precontrast imaging shows no stones in either kidney. Imaging after IV contrast administration shows no substantial change in the right renal cysts measuring up to about 8.7 cm in the upper pole. The cryoablation defect in the posterior aspect of the interpolar left kidney is stable in the interval with some dystrophic calcification along the cranial margin of the scar. No evidence for abnormal enhancement in the  cryoablation bed to suggest local recurrence. Other scattered tiny cysts in the left kidney are unchanged. Stomach/Bowel: Stomach is nondistended. No gastric wall thickening. No evidence of outlet obstruction. Duodenum is normally positioned as is the ligament of Treitz. Duodenal diverticulum noted. No small bowel or colonic dilatation within the visualized abdomen. Vascular/Lymphatic: There is abdominal aortic atherosclerosis without aneurysm. There is no gastrohepatic or hepatoduodenal ligament lymphadenopathy. No intraperitoneal or retroperitoneal lymphadenopathy. Other: No intraperitoneal free fluid. Musculoskeletal: Bone windows reveal no worrisome lytic or sclerotic osseous lesions. Superior endplate compression at L3 is unchanged. IMPRESSION: 1. Stable exam. No change in appearance of the left renal ablation defect. No findings to suggest recurrent or metastatic disease. 2. Most of the scattered tiny ground-glass nodules seen in the lungs previously have resolved in the interval. There is 1 5 mm ground-glass nodule in the anterior left upper lobe that is stable in the 1 year interval since the prior study and is not substantially changed when comparing back to an older study of 08/29/2015. Imaging features are compatible with benign etiology. 3. Stable small left adrenal adenoma. 4. Bilateral renal cysts. 5. Healed sternal fracture is associated with multilevel thoracolumbar compression fractures, stable in the interval. 6.  Aortic Atherosclerois (ICD10-170.0) Electronically Signed   By: Misty Stanley M.D.   On: 10/15/2017 15:17   Ct Abdomen W Wo Contrast  Result Date: 10/15/2017 CLINICAL DATA:  Left renal cryoablation for papillary carcinoma. EXAM: CT CHEST WITH CONTRAST CT ABDOMEN WITHOUT AND WITH CONTRAST TECHNIQUE: Multidetector CT imaging of the abdomen was performed without intravenous contrast. Multidetector CT imaging of the chest and abdomen was then performed during bolus administration of  intravenous contrast. CONTRAST:  159mL ISOVUE-300 IOPAMIDOL (ISOVUE-300) INJECTION 61% COMPARISON:  09/03/2016. FINDINGS: CT CHEST WITH CONTRAST Cardiovascular: The heart size is normal. No pericardial effusion. Coronary artery calcification is evident. Status post aortic valve replacement. Permanent pacemaker noted. Mediastinum/Nodes: No mediastinal lymphadenopathy. Scattered small calcified mediastinal and left hilar lymph nodes are stable. The esophagus has normal imaging features. There is no axillary lymphadenopathy. Lungs/Pleura: Biapical pleuroparenchymal scarring is stable. Tiny posterior right upper lobe ground-glass nodule seen previously has resolved in the interval. 10 mm peripheral right upper lobe ground-glass nodule seen on the previous study has resolved. 5 mm ground-glass nodule anterior left upper lobe is unchanged. Bibasilar chronic atelectasis or scarring seen previously is similar in the dependent lower lobes. No new pulmonary nodule or mass. No pleural effusion. Musculoskeletal: Bone windows reveal no worrisome lytic or sclerotic osseous lesions.  Superior endplate compression deformity is evident at T5, stable. T11 compression fracture also has similar imaging features. Healing sternal fracture again noted. CT ABDOMEN WITHOUT AND WITH CONTRAST Hepatobiliary: No focal abnormality within the liver parenchyma. There is no evidence for gallstones, gallbladder wall thickening, or pericholecystic fluid. No intrahepatic or extrahepatic biliary dilation. Pancreas: No focal mass lesion. No dilatation of the main duct. No intraparenchymal cyst. No peripancreatic edema. Spleen: No splenomegaly. No focal mass lesion. Adrenals/Urinary Tract: Right adrenal gland unremarkable. Stable 19 mm homogeneous low-attenuation left adrenal nodule with average attenuation of 7 Hounsfield units on precontrast imaging. Precontrast imaging shows no stones in either kidney. Imaging after IV contrast administration shows no  substantial change in the right renal cysts measuring up to about 8.7 cm in the upper pole. The cryoablation defect in the posterior aspect of the interpolar left kidney is stable in the interval with some dystrophic calcification along the cranial margin of the scar. No evidence for abnormal enhancement in the cryoablation bed to suggest local recurrence. Other scattered tiny cysts in the left kidney are unchanged. Stomach/Bowel: Stomach is nondistended. No gastric wall thickening. No evidence of outlet obstruction. Duodenum is normally positioned as is the ligament of Treitz. Duodenal diverticulum noted. No small bowel or colonic dilatation within the visualized abdomen. Vascular/Lymphatic: There is abdominal aortic atherosclerosis without aneurysm. There is no gastrohepatic or hepatoduodenal ligament lymphadenopathy. No intraperitoneal or retroperitoneal lymphadenopathy. Other: No intraperitoneal free fluid. Musculoskeletal: Bone windows reveal no worrisome lytic or sclerotic osseous lesions. Superior endplate compression at L3 is unchanged. IMPRESSION: 1. Stable exam. No change in appearance of the left renal ablation defect. No findings to suggest recurrent or metastatic disease. 2. Most of the scattered tiny ground-glass nodules seen in the lungs previously have resolved in the interval. There is 1 5 mm ground-glass nodule in the anterior left upper lobe that is stable in the 1 year interval since the prior study and is not substantially changed when comparing back to an older study of 08/29/2015. Imaging features are compatible with benign etiology. 3. Stable small left adrenal adenoma. 4. Bilateral renal cysts. 5. Healed sternal fracture is associated with multilevel thoracolumbar compression fractures, stable in the interval. 6.  Aortic Atherosclerois (ICD10-170.0) Electronically Signed   By: Misty Stanley M.D.   On: 10/15/2017 15:17   Ir Radiologist Eval & Mgmt  Result Date: 10/15/2017 Please refer to  notes tab for details about interventional procedure. (Op Note)   Labs:  CBC: No results for input(s): WBC, HGB, HCT, PLT in the last 8760 hours.  COAGS: No results for input(s): INR, APTT in the last 8760 hours.  BMP: Recent Labs    10/15/17 1339  CREATININE 1.20    Assessment and Plan:  Follow-up CT of the abdomen performed earlier today demonstrates a stable posterior lower pole ablation defect of the left kidney without evidence of residual enhancing carcinoma.  No new solid renal lesions are identified.  A follow-up CT of the chest was also performed demonstrating resolution of the previously noted 10 mm right upper lobe groundglass opacity.  A 5 mm left upper lobe groundglass nodule is stable.  This is likely benign and stable since 2017.  I told Eric Foster that no further routine follow-up imaging of the kidneys is necessary post ablation unless he had new symptoms referrable to the kidneys.  Given findings by chest CT today, no further routine CT imaging of the chest would also be necessary unless he were to develop new symptoms  or any new findings by x-ray.  Electronically SignedAletta Edouard T 10/15/2017, 5:15 PM     I spent a total of 15 Minutes in face to face in clinical consultation, greater than 50% of which was counseling/coordinating care post ablation of a left papillary renal carcinoma.

## 2020-10-20 DEATH — deceased
